# Patient Record
Sex: Male | Born: 1955
Health system: Southern US, Community
[De-identification: ages and names within clinical notes are randomized; demographics above are authoritative.]

## PROBLEM LIST (undated history)

## (undated) DIAGNOSIS — C61 Malignant neoplasm of prostate: Secondary | ICD-10-CM

## (undated) DIAGNOSIS — Z789 Other specified health status: Secondary | ICD-10-CM

## (undated) DIAGNOSIS — IMO0002 Reserved for concepts with insufficient information to code with codable children: Secondary | ICD-10-CM

## (undated) HISTORY — PX: OTHER SURGICAL HISTORY: SHX169

## (undated) HISTORY — DX: Malignant neoplasm of prostate: C61

## (undated) HISTORY — PX: PROSTATE BIOPSY: SHX241

---

## 2001-12-25 DIAGNOSIS — IMO0002 Reserved for concepts with insufficient information to code with codable children: Secondary | ICD-10-CM

## 2001-12-25 HISTORY — DX: Reserved for concepts with insufficient information to code with codable children: IMO0002

## 2004-08-17 ENCOUNTER — Ambulatory Visit (HOSPITAL_COMMUNITY): Admission: RE | Admit: 2004-08-17 | Discharge: 2004-08-17 | Payer: Self-pay | Admitting: Family Medicine

## 2004-09-26 ENCOUNTER — Encounter: Admission: RE | Admit: 2004-09-26 | Discharge: 2004-09-26 | Payer: Self-pay | Admitting: Neurosurgery

## 2009-06-01 ENCOUNTER — Encounter: Admission: RE | Admit: 2009-06-01 | Discharge: 2009-06-01 | Payer: Self-pay | Admitting: Family Medicine

## 2011-01-14 ENCOUNTER — Encounter: Payer: Self-pay | Admitting: Neurosurgery

## 2014-06-20 ENCOUNTER — Emergency Department (HOSPITAL_COMMUNITY)
Admission: EM | Admit: 2014-06-20 | Discharge: 2014-06-20 | Disposition: A | Discharge status: Home / Self Care | Payer: BC Managed Care – PPO | Attending: Emergency Medicine | Admitting: Emergency Medicine

## 2014-06-20 ENCOUNTER — Encounter (HOSPITAL_COMMUNITY): Payer: Self-pay | Admitting: Emergency Medicine

## 2014-06-20 DIAGNOSIS — J028 Acute pharyngitis due to other specified organisms: Secondary | ICD-10-CM

## 2014-06-20 DIAGNOSIS — B9789 Other viral agents as the cause of diseases classified elsewhere: Secondary | ICD-10-CM

## 2014-06-20 DIAGNOSIS — J029 Acute pharyngitis, unspecified: Secondary | ICD-10-CM | POA: Insufficient documentation

## 2014-06-20 LAB — RAPID STREP SCREEN (MED CTR MEBANE ONLY): Streptococcus, Group A Screen (Direct): NEGATIVE

## 2014-06-20 MED ORDER — LIDOCAINE VISCOUS 2 % MT SOLN: 15.0000 mL | OROMUCOSAL | Status: DC | PRN

## 2014-06-20 NOTE — ED Provider Notes (Signed)
CSN: [id number]     Arrival date & time 06/20/14  1002 History   First MD Initiated Contact with Patient 06/20/14 1043     Chief Complaint  Patient presents with  . Sore Throat     (Consider location/radiation/quality/duration/timing/severity/associated sxs/prior Treatment) HPI  Terry Davenport is a 58 y.o. male complaining of sore throat onset 2 days ago associated with minimal rhinorrhea and medication was exposed to strep pharyngitis from his nephew several days ago. Patient denies cough, shortness of breath, chest pain, abdominal pain, nausea, vomiting, rash, headache, cervicalgia, change in bowel or bladder habits, nausea or vomiting. Issues going on vacation tomorrow and is worried that he will get sicker on vacation. Eating and drinking normally.  History reviewed. No pertinent past medical history. History reviewed. No pertinent past surgical history. Family History  Problem Relation Age of Onset  . Heart failure Mother   . Diabetes Mother   . Hypertension Mother   . Cancer Mother    History  Substance Use Topics  . Smoking status: Never Smoker   . Smokeless tobacco: Never Used  . Alcohol Use: Yes     Comment: 6 beers/week    Review of Systems  10 systems reviewed and found to be negative, except as noted in the HPI.   Allergies  Sulfa antibiotics  Home Medications   Prior to Admission medications   Medication Sig Start Date End Date Taking? Authorizing Provider  clobetasol (TEMOVATE) 0.05 % external solution Apply 1 application topically 2 (two) times daily as needed (itching).   Yes Historical Provider, MD  Multiple Vitamin (MULTIVITAMIN WITH MINERALS) TABS tablet Take 1 tablet by mouth daily.   Yes Historical Provider, MD  lidocaine (XYLOCAINE) 2 % solution Use as directed 15 mLs in the mouth or throat every 4 (four) hours as needed. 06/20/14   Nicole Pisciotta, PA-C   BP 131/91  Pulse 69  Temp(Src) 98.2 F (36.8 C) (Oral)  Resp 14  Ht 6\' 1"  (1.854 m)  Wt  163 lb (73.936 kg)  BMI 21.51 kg/m2  SpO2 99% Physical Exam  Nursing note and vitals reviewed. Constitutional: He is oriented to person, place, and time. He appears well-developed and well-nourished. No distress.  HENT:  Head: Normocephalic and atraumatic.  Mouth/Throat: Oropharynx is clear and moist.  No drooling or stridor. Posterior pharynx mildly erythematous no significant tonsillar hypertrophy. No exudate. Soft palate rises symmetrically. No TTP or induration under tongue.   No tenderness to palpation of frontal or bilateral maxillary sinuses.  No mucosal edema in the nares.  Bilateral tympanic membranes with normal architecture and good light reflex.    Eyes: Conjunctivae and EOM are normal. Pupils are equal, round, and reactive to light.  Neck: Normal range of motion.  Cardiovascular: Normal rate, regular rhythm and intact distal pulses.   Pulmonary/Chest: Effort normal and breath sounds normal. No stridor. No respiratory distress. He has no wheezes. He has no rales. He exhibits no tenderness.  Abdominal: Soft. Bowel sounds are normal. He exhibits no distension and no mass. There is no tenderness. There is no rebound and no guarding.  Musculoskeletal: Normal range of motion. He exhibits no edema and no tenderness.  Lymphadenopathy:    He has no cervical adenopathy.  Neurological: He is alert and oriented to person, place, and time.  Psychiatric:  anxious    ED Course  Procedures (including critical care time) Labs Review Labs Reviewed  RAPID STREP SCREEN  CULTURE, GROUP A STREP    Imaging Review  No results found.   EKG Interpretation None      MDM   Final diagnoses:  Acute viral pharyngitis    Filed Vitals:   06/20/14 1011  BP: 131/91  Pulse: 69  Temp: 98.2 F (36.8 C)  TempSrc: Oral  Resp: 14  Height: 6\' 1"  (1.854 m)  Weight: 163 lb (73.936 kg)  SpO2: 99%    Medications - No data to display  Terry Davenport is a 58 y.o. male presenting with  sore throat for 2 days. H. patient is afebrile. Physical exam is not consistent with a strep pharyngitis. Rapid strep is negative.  Evaluation does not show pathology that would require ongoing emergent intervention or inpatient treatment. Pt is hemodynamically stable and mentating appropriately. Discussed findings and plan with patient/guardian, who agrees with care plan. All questions answered. Return precautions discussed and outpatient follow up given.   New Prescriptions   LIDOCAINE (XYLOCAINE) 2 % SOLUTION    Use as directed 15 mLs in the mouth or throat every 4 (four) hours as needed.         Monico Blitz, PA-C 06/20/14 1141

## 2014-06-20 NOTE — Discharge Instructions (Signed)
Push fluids: take small frequent sips of water or Gatorade, do not drink any soda, juice or caffeinated beverages.    For pain control please take Ibuprofen (also known as Motrin or Advil) 400mg  (this is normally 2 over the counter pills) every 6 hours. Take with food to minimize stomach irritation.  Please follow with your primary care doctor in the next 2 days for a check-up. They must obtain records for further management.   Do not hesitate to return to the Emergency Department for any new, worsening or concerning symptoms.

## 2014-06-20 NOTE — ED Provider Notes (Signed)
Medical screening examination/treatment/procedure(s) were performed by non-physician practitioner and as supervising physician I was immediately available for consultation/collaboration.    [name], MD 06/20/14 1617 

## 2014-06-20 NOTE — ED Notes (Signed)
Patient states he has had a sore throat x 2 days. Patient states he has been around a relative that had strep throat.

## 2014-06-22 LAB — CULTURE, GROUP A STREP

## 2014-09-02 ENCOUNTER — Ambulatory Visit: Payer: BC Managed Care – PPO | Admitting: Radiation Oncology

## 2014-09-02 ENCOUNTER — Ambulatory Visit: Payer: BC Managed Care – PPO

## 2014-09-04 ENCOUNTER — Encounter: Payer: Self-pay | Admitting: Radiation Oncology

## 2014-09-04 NOTE — Progress Notes (Signed)
GU Location of Tumor / Histology: prostatic adenocarcinoma  If Prostate Cancer, Gleason Score is (3 + 3) and PSA is (4.50)  Clydene Laming presented to Dr. Jasmine December in June 2015 with an elevated PSA of 4.08  Biopsies of prostate (if applicable) revealed:    Past/Anticipated interventions by urology, if any: prostate biopsy and referral to Dr. Tammi Klippel   Past/Anticipated interventions by medical oncology, if any: no  Weight changes, if any: no  Bowel/Bladder complaints, if any: no   Nausea/Vomiting, if any: no  Pain issues, if any:  no  SAFETY ISSUES:  Prior radiation? no  Pacemaker/ICD? no  Possible current pregnancy? no  Is the patient on methotrexate? no  Current Complaints / other details:  58 year old male. Married with 1 son.  Art gallery manager for [employer]. QQ:VZDGL drugs 29.5 gram prostate.

## 2014-09-06 ENCOUNTER — Encounter: Payer: Self-pay | Admitting: Radiation Oncology

## 2014-09-06 DIAGNOSIS — C61 Malignant neoplasm of prostate: Secondary | ICD-10-CM | POA: Insufficient documentation

## 2014-09-06 NOTE — Progress Notes (Signed)
Radiation Oncology         [phone]) [phone] ________________________________  Initial outpatient Consultation  Name: [name] MRN: [id number]  Date: 09/07/2014  DOB: [birth year]  VO:ZDGUY,QIHKV RUTH, MD  Alexis Frock, MD   REFERRING PHYSICIAN: Alexis Frock, MD  DIAGNOSIS: 58 y.o. gentleman with stage T1c adenocarcinoma of the prostate with a Gleason's score of 3+3 and a PSA of 4.5  HISTORY OF PRESENT ILLNESS::Terry Davenport is a 58 y.o. gentleman.  He was noted to have an elevated PSA of 4.08 on 05/08/14 by his primary care physician, Dr. Darcus Austin.  Accordingly, he was referred for evaluation in urology by Dr. Jasmine December on 06/01/14,  digital rectal examination was performed at that time revealing a 30 gm prostate with no nodules.  Repeat PSA on 05/28/14 remained elevated at 4.5.  The patient proceeded to transrectal ultrasound with 12 biopsies of the prostate on 08/03/14.  The prostate volume measured 29.53 cc.  Out of 12 core biopsies, 3 were positive.  The maximum Gleason score was 3+3, and this was seen in the distribution seen below on the right side.  The patient reviewed the biopsy results with his urologist and he has kindly been referred today for discussion of potential radiation treatment options.  PREVIOUS RADIATION THERAPY: No  PAST MEDICAL HISTORY:  has a past medical history of Prostate cancer.    PAST SURGICAL HISTORY: Past Surgical History  Procedure Laterality Date  . Prostate biopsy      FAMILY HISTORY: family history includes Cancer in his mother; Diabetes in his mother; Heart failure in his mother; Hypertension in his mother.  SOCIAL HISTORY:  reports that he has never smoked. He has never used smokeless tobacco. He reports that he drinks alcohol. He reports that he does not use illicit drugs.  ALLERGIES: Sulfa antibiotics  MEDICATIONS:  Current Outpatient Prescriptions  Medication Sig Dispense Refill  . Ascorbic Acid (VITAMIN C) 1000 MG tablet Take 1,000 mg  by mouth daily.      . Multiple Vitamin (MULTIVITAMIN WITH MINERALS) TABS tablet Take 1 tablet by mouth daily.       No current facility-administered medications for this encounter.    REVIEW OF SYSTEMS:  A 15 point review of systems is documented in the electronic medical record. This was obtained by the nursing staff. However, I reviewed this with the patient to discuss relevant findings and make appropriate changes.  A comprehensive review of systems was negative..  The patient completed an IPSS and IIEF questionnaire.  His IPSS score was low indicating mild urinary outflow obstructive symptoms.  He indicated that his erectile function is able to complete sexual activity on most attempts.   PHYSICAL EXAM: This patient is in no acute distress.  He is alert and oriented.   height is 6\' 1"  (1.854 m) and weight is 168 lb 14.4 oz (76.613 kg). His oral temperature is 98 F (36.7 C). His blood pressure is 133/82 and his pulse is 62. His respiration is 16 and oxygen saturation is 100%.  He exhibits no respiratory distress or labored breathing.  He appears neurologically intact.  His mood is pleasant.  His affect is appropriate.  Please note the digital rectal exam findings described above.  KPS = 100  100 - Normal; no complaints; no evidence of disease. 90   - Able to carry on normal activity; minor signs or symptoms of disease. 80   - Normal activity with effort; some signs or symptoms of disease. 70   -  Cares for self; unable to carry on normal activity or to do active work. 60   - Requires occasional assistance, but is able to care for most of his personal needs. 50   - Requires considerable assistance and frequent medical care. 77   - Disabled; requires special care and assistance. 50   - Severely disabled; hospital admission is indicated although death not imminent. 51   - Very sick; hospital admission necessary; active supportive treatment necessary. 10   - Moribund; fatal processes  progressing rapidly. 0     - Dead  Karnofsky DA, Abelmann WH, Craver LS and Burchenal JH [phone]) The use of the nitrogen mustards in the palliative treatment of carcinoma: with particular reference to bronchogenic carcinoma Cancer 1 [phone]   LABORATORY DATA:  No results found for this basename: WBC,  HGB,  HCT,  MCV,  PLT   No results found for this basename: NA,  K,  CL,  CO2   No results found for this basename: ALT,  AST,  GGT,  ALKPHOS,  BILITOT     RADIOGRAPHY: No results found.    IMPRESSION: This gentleman is a 58 y.o. gentleman with stage T1c adenocarcinoma of the prostate with a Gleason's score of 3+3 and a PSA of 4.5.  His T-Stage, Gleason's Score, and PSA put him into the favorable or low risk group.  Accordingly he is eligible for a variety of potential treatment options including active surveillance, radical prostatectomy, external radiotherapy, or prostate brachytherapy.  PLAN:Today I reviewed the findings and workup thus far.  We discussed the natural history of prostate cancer.  We reviewed the the implications of T-stage, Gleason's Score, and PSA on decision-making and outcomes in prostate cancer.  We discussed radiation treatment in the management of prostate cancer with regard to the logistics and delivery of external beam radiation treatment as well as the logistics and delivery of prostate brachytherapy.  We compared and contrasted each of these approaches and also compared these against prostatectomy.  The patient expressed some interest in prostate brachytherapy, but, remains undecided and may elect for surgery.  He sees Dr. Alinda Money for 2nd opinion soon.  I will share my findings with Dr. Tresa Moore.  I enjoyed meeting with him today, and will look forward to participating in the care of this very nice gentleman.   I spent 60 minutes face to face with the patient and more than 50% of that time was spent in counseling and/or coordination of care.     ------------------------------------------------  Sheral Apley. Tammi Klippel, M.D.

## 2014-09-07 ENCOUNTER — Ambulatory Visit
Admission: RE | Admit: 2014-09-07 | Discharge: 2014-09-07 | Disposition: A | Discharge status: Home / Self Care | Payer: BC Managed Care – PPO | Source: Ambulatory Visit | Attending: Radiation Oncology | Admitting: Radiation Oncology

## 2014-09-07 ENCOUNTER — Telehealth: Payer: Self-pay | Admitting: Radiation Oncology

## 2014-09-07 ENCOUNTER — Encounter: Payer: Self-pay | Admitting: Radiation Oncology

## 2014-09-07 VITALS — BP 133/82 | HR 62 | Temp 98.0°F | Resp 16 | Ht 73.0 in | Wt 168.9 lb

## 2014-09-07 DIAGNOSIS — Z510 Encounter for antineoplastic radiation therapy: Secondary | ICD-10-CM | POA: Insufficient documentation

## 2014-09-07 DIAGNOSIS — C61 Malignant neoplasm of prostate: Secondary | ICD-10-CM | POA: Diagnosis not present

## 2014-09-07 NOTE — Progress Notes (Signed)
Reports a weaker urine stream compared to his youth but, denies this is problematic. Reports rare does his urine stream stop and start. Denies difficulty emptying his bladder, leakage or incontinence. Vitals stable. Denies pain. Denies nocturia or dysuria. Denies constipation, diarrhea, blood in stool or referred pain to prostate. Reports intermittent aching since biopsy in his left testicle. Denies weight loss or night sweats.

## 2014-09-07 NOTE — Progress Notes (Signed)
See progress note under physician encounter. 

## 2014-09-07 NOTE — Telephone Encounter (Signed)
Patient a no show for 1430 appointment with Dr. Tammi Klippel.Phoned patient at home and on his cell. No answer. Left message requesting return call to reschedule.

## 2014-09-11 ENCOUNTER — Encounter: Payer: Self-pay | Admitting: *Deleted

## 2014-09-11 NOTE — Progress Notes (Signed)
[place] Psychosocial Distress Screening Clinical Social Work  Clinical Social Work was referred by distress screening protocol.  The patient scored a 7 on the Psychosocial Distress Thermometer which indicates severe distress. Clinical Social Worker phoned to assess for distress and other psychosocial needs as he had no other appointments scheduled at this time. Pt open with CSW and reports he was well informed at his appointment. He is still trying to figure out his plan to treat his prostate cancer. CSW introduced self and role of Patient and Family Support Team. CSW also discussed Prostate Support Group and other resources at the [facility] to assist. Pt reports he feels less distressed currently and has good family support to assist him. He reports good benefits through his work to assist as well. Pt aware how to contact CSW as needed.  No other needs identified.    ONCBCN DISTRESS SCREENING 09/07/2014  Screening Type Initial Screening  Mark the number that describes how much distress you have been experiencing in the past week 7    Clinical Social Worker follow up needed: No.  If yes, follow up plan:  Loren Racer, [place]  [facility] Phone: [phone] Fax: [phone]

## 2014-09-21 ENCOUNTER — Other Ambulatory Visit: Payer: Self-pay | Admitting: Urology

## 2014-11-04 ENCOUNTER — Encounter (HOSPITAL_COMMUNITY)
Admission: RE | Admit: 2014-11-04 | Discharge: 2014-11-04 | Disposition: A | Discharge status: Home / Self Care | Payer: BC Managed Care – PPO | Source: Ambulatory Visit | Attending: Urology | Admitting: Urology

## 2014-11-04 ENCOUNTER — Encounter (INDEPENDENT_AMBULATORY_CARE_PROVIDER_SITE_OTHER): Payer: Self-pay

## 2014-11-04 ENCOUNTER — Ambulatory Visit (HOSPITAL_COMMUNITY)
Admission: RE | Admit: 2014-11-04 | Discharge: 2014-11-04 | Disposition: A | Discharge status: Home / Self Care | Payer: BC Managed Care – PPO | Source: Ambulatory Visit | Attending: Urology | Admitting: Urology

## 2014-11-04 ENCOUNTER — Encounter (HOSPITAL_COMMUNITY): Payer: Self-pay

## 2014-11-04 DIAGNOSIS — C61 Malignant neoplasm of prostate: Secondary | ICD-10-CM

## 2014-11-04 DIAGNOSIS — Z01818 Encounter for other preprocedural examination: Secondary | ICD-10-CM | POA: Diagnosis not present

## 2014-11-04 HISTORY — DX: Other specified health status: Z78.9

## 2014-11-04 HISTORY — DX: Reserved for concepts with insufficient information to code with codable children: IMO0002

## 2014-11-04 LAB — CBC
HCT: 46.2 % (ref 39.0–52.0)
Hemoglobin: 15.5 g/dL (ref 13.0–17.0)
MCH: 29.3 pg (ref 26.0–34.0)
MCHC: 33.5 g/dL (ref 30.0–36.0)
MCV: 87.3 fL (ref 78.0–100.0)
PLATELETS: 334 10*3/uL (ref 150–400)
RBC: 5.29 MIL/uL (ref 4.22–5.81)
RDW: 12.3 % (ref 11.5–15.5)
WBC: 5.4 10*3/uL (ref 4.0–10.5)

## 2014-11-04 LAB — BASIC METABOLIC PANEL
Anion gap: 8 (ref 5–15)
BUN: 14 mg/dL (ref 6–23)
CO2: 30 mEq/L (ref 19–32)
CREATININE: 0.97 mg/dL (ref 0.50–1.35)
Calcium: 9.7 mg/dL (ref 8.4–10.5)
Chloride: 105 mEq/L (ref 96–112)
GFR, EST NON AFRICAN AMERICAN: 90 mL/min — AB (ref >90)
Glucose, Bld: 88 mg/dL (ref 70–99)
Potassium: 4.9 mEq/L (ref 3.7–5.3)
SODIUM: 143 meq/L (ref 137–147)

## 2014-11-04 LAB — ABO/RH: ABO/RH(D): A POS

## 2014-11-04 NOTE — Patient Instructions (Signed)
FOLLOW YOUR BOWEL PREP INSTRUCTIONS DAY BEFORE SURGERY - INSTRUCTIONS ARE FROM DR. BORDEN'S OFFICE.      YOUR SURGERY IS SCHEDULED AT [facility]  ON:  Monday  November 16  REPORT TO  SHORT STAY CENTER AT:  9:00 AM    DO NOT EAT OR DRINK ANYTHING AFTER MIDNIGHT THE NIGHT BEFORE YOUR SURGERY.  YOU MAY BRUSH YOUR TEETH, RINSE OUT YOUR MOUTH--BUT NO WATER, NO FOOD, NO CHEWING GUM, NO MINTS, NO CANDIES, NO CHEWING TOBACCO.  PLEASE TAKE THE FOLLOWING MEDICATIONS THE AM OF YOUR SURGERY WITH A FEW SIPS OF WATER:  NO MEDICATIONS TO TAKE  .  DO NOT BRING VALUABLES, MONEY, CREDIT CARDS.  DO NOT WEAR JEWELRY, MAKE-UP, NAIL POLISH AND NO METAL PINS OR CLIPS IN YOUR HAIR. CONTACT LENS, DENTURES / PARTIALS, GLASSES SHOULD NOT BE WORN TO SURGERY AND IN MOST CASES-HEARING AIDS WILL NEED TO BE REMOVED.  BRING YOUR GLASSES CASE, ANY EQUIPMENT NEEDED FOR YOUR CONTACT LENS. FOR PATIENTS ADMITTED TO THE HOSPITAL--CHECK OUT TIME THE DAY OF DISCHARGE IS 11:00 AM.  ALL INPATIENT ROOMS ARE PRIVATE - WITH BATHROOM, TELEPHONE, TELEVISION AND WIFI INTERNET.    PLEASE BE AWARE THAT YOU MAY NEED ADDITIONAL BLOOD DRAWN DAY OF YOUR SURGERY  _______________________________________________________________________   [facility] - Preparing for Surgery Before surgery, you can play an important role.  Because skin is not sterile, your skin needs to be as free of germs as possible.  You can reduce the number of germs on your skin by washing with CHG (chlorahexidine gluconate) soap before surgery.  CHG is an antiseptic cleaner which kills germs and bonds with the skin to continue killing germs even after washing. Please DO NOT use if you have an allergy to CHG or antibacterial soaps.  If your skin becomes reddened/irritated stop using the CHG and inform your nurse when you arrive at Short Stay. Do not shave (including legs and underarms) for at least 48 hours prior to the first CHG shower.  You may shave your  face/neck. Please follow these instructions carefully:  1.  Shower with CHG Soap the night before surgery and the  morning of Surgery.  2.  If you choose to wash your hair, wash your hair first as usual with your  normal  shampoo.  3.  After you shampoo, rinse your hair and body thoroughly to remove the  shampoo.                           4.  Use CHG as you would any other liquid soap.  You can apply chg directly  to the skin and wash                       Gently with a scrungie or clean washcloth.  5.  Apply the CHG Soap to your body ONLY FROM THE NECK DOWN.   Do not use on face/ open                           Wound or open sores. Avoid contact with eyes, ears mouth and genitals (private parts).                       Wash face,  Genitals (private parts) with your normal soap.             6.  Wash thoroughly,  paying special attention to the area where your surgery  will be performed.  7.  Thoroughly rinse your body with warm water from the neck down.  8.  DO NOT shower/wash with your normal soap after using and rinsing off  the CHG Soap.                9.  Pat yourself dry with a clean towel.            10.  Wear clean pajamas.            11.  Place clean sheets on your bed the night of your first shower and do not  sleep with pets. Day of Surgery : Do not apply any lotions/deodorants the morning of surgery.  Please wear clean clothes to the hospital/surgery center.  FAILURE TO FOLLOW THESE INSTRUCTIONS MAY RESULT IN THE CANCELLATION OF YOUR SURGERY PATIENT SIGNATURE_________________________________  NURSE SIGNATURE__________________________________  ________________________________________________________________________   Adam Phenix  An incentive spirometer is a tool that can help keep your lungs clear and active. This tool measures how well you are filling your lungs with each breath. Taking long deep breaths may help reverse or decrease the chance of developing breathing  (pulmonary) problems (especially infection) following:  A long period of time when you are unable to move or be active. BEFORE THE PROCEDURE   If the spirometer includes an indicator to show your best effort, your nurse or respiratory therapist will set it to a desired goal.  If possible, sit up straight or lean slightly forward. Try not to slouch.  Hold the incentive spirometer in an upright position. INSTRUCTIONS FOR USE   Sit on the edge of your bed if possible, or sit up as far as you can in bed or on a chair.  Hold the incentive spirometer in an upright position.  Breathe out normally.  Place the mouthpiece in your mouth and seal your lips tightly around it.  Breathe in slowly and as deeply as possible, raising the piston or the ball toward the top of the column.  Hold your breath for 3-5 seconds or for as long as possible. Allow the piston or ball to fall to the bottom of the column.  Remove the mouthpiece from your mouth and breathe out normally.  Rest for a few seconds and repeat Steps 1 through 7 at least 10 times every 1-2 hours when you are awake. Take your time and take a few normal breaths between deep breaths.  The spirometer may include an indicator to show your best effort. Use the indicator as a goal to work toward during each repetition.  After each set of 10 deep breaths, practice coughing to be sure your lungs are clear. If you have an incision (the cut made at the time of surgery), support your incision when coughing by placing a pillow or rolled up towels firmly against it. Once you are able to get out of bed, walk around indoors and cough well. You may stop using the incentive spirometer when instructed by your caregiver.  RISKS AND COMPLICATIONS  Take your time so you do not get dizzy or light-headed.  If you are in pain, you may need to take or ask for pain medication before doing incentive spirometry. It is harder to take a deep breath if you are having  pain. AFTER USE  Rest and breathe slowly and easily.  It can be helpful to keep track of a log of your progress. Your caregiver  can provide you with a simple table to help with this. If you are using the spirometer at home, follow these instructions: [place] IF:   You are having difficultly using the spirometer.  You have trouble using the spirometer as often as instructed.  Your pain medication is not giving enough relief while using the spirometer.  You develop fever of 100.5 F (38.1 C) or higher. SEEK IMMEDIATE MEDICAL CARE IF:   You cough up bloody sputum that had not been present before.  You develop fever of 102 F (38.9 C) or greater.  You develop worsening pain at or near the incision site. MAKE SURE YOU:   Understand these instructions.  Will watch your condition.  Will get help right away if you are not doing well or get worse. Document Released: 04/23/2007 Document Revised: 03/04/2012 Document Reviewed: 06/24/2007 Exit[employer] Patient Information 2014 ExitCare, [place].   ________________________________________________________________________  WHAT IS A BLOOD TRANSFUSION? Blood Transfusion Information  A transfusion is the replacement of blood or some of its parts. Blood is made up of multiple cells which provide different functions.  Red blood cells carry oxygen and are used for blood loss replacement.  White blood cells fight against infection.  Platelets control bleeding.  Plasma helps clot blood.  Other blood products are available for specialized needs, such as hemophilia or other clotting disorders. BEFORE THE TRANSFUSION  Who gives blood for transfusions?   Healthy volunteers who are fully evaluated to make sure their blood is safe. This is blood bank blood. Transfusion therapy is the safest it has ever been in the practice of medicine. Before blood is taken from a donor, a complete history is taken to make sure that person has no history  of diseases nor engages in risky social behavior (examples are intravenous drug use or sexual activity with multiple partners). The donor's travel history is screened to minimize risk of transmitting infections, such as malaria. The donated blood is tested for signs of infectious diseases, such as HIV and hepatitis. The blood is then tested to be sure it is compatible with you in order to minimize the chance of a transfusion reaction. If you or a relative donates blood, this is often done in anticipation of surgery and is not appropriate for emergency situations. It takes many days to process the donated blood. RISKS AND COMPLICATIONS Although transfusion therapy is very safe and saves many lives, the main dangers of transfusion include:   Getting an infectious disease.  Developing a transfusion reaction. This is an allergic reaction to something in the blood you were given. Every precaution is taken to prevent this. The decision to have a blood transfusion has been considered carefully by your caregiver before blood is given. Blood is not given unless the benefits outweigh the risks. AFTER THE TRANSFUSION  Right after receiving a blood transfusion, you will usually feel much better and more energetic. This is especially true if your red blood cells have gotten low (anemic). The transfusion raises the level of the red blood cells which carry oxygen, and this usually causes an energy increase.  The nurse administering the transfusion will monitor you carefully for complications. HOME CARE INSTRUCTIONS  No special instructions are needed after a transfusion. You may find your energy is better. Speak with your caregiver about any limitations on activity for underlying diseases you may have. SEEK MEDICAL CARE IF:   Your condition is not improving after your transfusion.  You develop redness or irritation at the intravenous (  IV) site. SEEK IMMEDIATE MEDICAL CARE IF:  Any of the following symptoms  occur over the next 12 hours:  Shaking chills.  You have a temperature by mouth above 102 F (38.9 C), not controlled by medicine.  Chest, back, or muscle pain.  People around you feel you are not acting correctly or are confused.  Shortness of breath or difficulty breathing.  Dizziness and fainting.  You get a rash or develop hives.  You have a decrease in urine output.  Your urine turns a dark color or changes to pink, red, or brown. Any of the following symptoms occur over the next 10 days:  You have a temperature by mouth above 102 F (38.9 C), not controlled by medicine.  Shortness of breath.  Weakness after normal activity.  The white part of the eye turns yellow (jaundice).  You have a decrease in the amount of urine or are urinating less often.  Your urine turns a dark color or changes to pink, red, or brown. Document Released: 12/08/2000 Document Revised: 03/04/2012 Document Reviewed: 07/27/2008 [facility] Patient Information 2014 [place], [place].  _______________________________________________________________________

## 2014-11-04 NOTE — Pre-Procedure Instructions (Signed)
EKG AND CXR WERE DONE TODAY PREOP AT [facility]. PT'S CHART GIVEN TO FOLLOW UP NURSE SHARON SHOFFNER, RN FOR REVIEW OF ALL PREOP TEST RESULTS.

## 2014-11-08 NOTE — H&P (Signed)
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 This eventually prompted a prostate needle biopsy by Dr. Tresa Moore on 08/03/14 confirming Gleason 3+3=6 adenocarcinoma of the prostate with 3 out of 12 biopsy cores positive for malignancy. He has no family history of prostate cancer and he is otherwise completely healthy with no other medical comorbidities. He has elected to proceed with surgical therapy.    TNM stage: cT1c Nx Mx  PSA: 4.5  Gleason score: 3+3=6  Biopsy (810/15): 3/12 cores positive -- R apex (20%), R lateral mid (30%), R base (5%)  Prostate volume: 29.5 cc  PSAD: 0.15    Nomogram  OC disease: 79%  EPE: 16%  SVI: 1%  LNI: 1%  PFS (surgery): 94% at 5 years and 90% at 10 years    Urinary function: He has minimal lower urinary tract symptoms. IPSS is 4.  Erectile function: He denies erectile dysfunction. SHIM score is 24.     Interval history:    He returns today after having made the decision to proceed with surgical treatment of his prostate cancer.     Past Medical History Problems  1. History of No significant past medical history  Surgical History Problems  1. History of No Surgical Problems  Current Meds 1. Ciprofloxacin HCl - 500 MG Oral Tablet; 1 tab PO BID x 6 days. Begin 3 days before  biopsy;  Therapy: [id number] to (Last Rx:13Jul2015)  Requested for: 13Jul2015 Ordered 2. Multi-Vitamin TABS;  Therapy: (Recorded:04Jun2015) to Recorded 3. Vitamin C TABS;  Therapy: (Recorded:13Jul2015) to Recorded  Allergies Medication  1. Sulfa Drugs  Family History Problems  1. Family history of Emphysema/COPD : Father 2. Family history of chronic obstructive pulmonary disease (Z82.5) : Father 3. Family history of hypertension (Z82.49) : Mother 4. Family history of malignant neoplasm of breast (Z80.3) : Mother  Social  History Problems  1. Activities of daily living (ADL's), independent 2. Alcohol use (F10.99)   <1 a day 3. Exercise habits   No formal exercise at the gym however he indicates he walks systolic for 30 minutes on     a daily basis and is independent with all chores. 4. Married 5. Never a smoker 6. Occupation   Chief Financial Officer at [employer] Includes: Last 1 Day]  Recorded: 20Oct2015 08:22AM  Blood Pressure: 134 / 73 Temperature: 97.6 F Heart Rate: 64  Physical Exam Constitutional: Well nourished and well developed . No acute distress.  ENT:. The ears and nose are normal in appearance.  Neck: The appearance of the neck is normal and no neck mass is present.  Pulmonary: No respiratory distress and normal respiratory rhythm and effort.  Cardiovascular: Heart rate and rhythm are normal . No peripheral edema.  Neuro/Psych:. Mood and affect are appropriate.    Results/Data Urine [Data Includes: Last 1 Day]   20Oct2015  COLOR YELLOW   APPEARANCE CLEAR   SPECIFIC GRAVITY 1.020   pH 5.5   GLUCOSE NEG mg/dL  BILIRUBIN NEG   KETONE NEG mg/dL  BLOOD NEG   PROTEIN NEG mg/dL  UROBILINOGEN 0.2 mg/dL  NITRITE NEG   LEUKOCYTE ESTERASE NEG    Assessment Assessed  1. Prostate cancer (C61)  Plan Health Maintenance  1. UA With REFLEX; [Do Not Release]; Status:Complete;   Done: 20Oct2015 08:15AM Prostate cancer  2. Follow-up Keep Future Appt Office  Follow-up  Status: Complete  Done: 20Oct2015  Discussion/Summary 1. Prostate cancer: He is ready to proceed with surgical treatment of his prostate cancer and has been scheduled. He does have some questions regarding his physical therapy exercises and I will ask Hilda Blades to address these with him prior to his surgery. He feels very well informed and all questions have been answered to his stated satisfaction.   We discussed surgical therapy for prostate cancer including the different available surgical approaches. We  discussed, in detail, the risks and expectations of surgery with regard to cancer control, urinary control, and erectile function as well as the expected postoperative recovery process. Additional risks of surgery including but not limited to bleeding, infection, hernia formation, nerve damage, lymphocele formation, bowel/rectal injury potentially necessitating colostomy, damage to the urinary tract resulting in urine leakage, urethral stricture, and the cardiopulmonary risks such as myocardial infarction, stroke, death, venothromboembolism, etc. were explained. The risk of open surgical conversion for robotic/laparoscopic prostatectomy was also discussed.     We will plan to proceed with a bilateral nerve sparing robotic-assisted left prostatic radical prostatectomy.    Cc: Dr. Darcus Austin  A total of 55 minutes were spent in the overall care of the patient today with 50 minutes in direct face to face consultation.    Signatures Electronically signed by : Raynelle Bring, M.D.; Oct 13 2014  2:03PM EST

## 2014-11-09 ENCOUNTER — Inpatient Hospital Stay (HOSPITAL_COMMUNITY)
Admission: RE | Admit: 2014-11-09 | Discharge: 2014-11-10 | DRG: 708 | Disposition: A | Discharge status: Home / Self Care | Payer: BC Managed Care – PPO | Source: Ambulatory Visit | Attending: Urology | Admitting: Urology

## 2014-11-09 ENCOUNTER — Encounter (HOSPITAL_COMMUNITY): Payer: Self-pay | Admitting: *Deleted

## 2014-11-09 ENCOUNTER — Inpatient Hospital Stay (HOSPITAL_COMMUNITY): Payer: BC Managed Care – PPO | Admitting: Certified Registered Nurse Anesthetist

## 2014-11-09 ENCOUNTER — Encounter (HOSPITAL_COMMUNITY)
Admission: RE | Disposition: A | Discharge status: Home / Self Care | Payer: Self-pay | Source: Ambulatory Visit | Attending: Urology

## 2014-11-09 DIAGNOSIS — C61 Malignant neoplasm of prostate: Secondary | ICD-10-CM | POA: Diagnosis present

## 2014-11-09 HISTORY — PX: ROBOT ASSISTED LAPAROSCOPIC RADICAL PROSTATECTOMY: SHX5141

## 2014-11-09 LAB — TYPE AND SCREEN
ABO/RH(D): A POS
Antibody Screen: NEGATIVE

## 2014-11-09 LAB — HEMOGLOBIN AND HEMATOCRIT, BLOOD
HEMATOCRIT: 42.5 % (ref 39.0–52.0)
HEMOGLOBIN: 14.7 g/dL (ref 13.0–17.0)

## 2014-11-09 SURGERY — ROBOTIC ASSISTED LAPAROSCOPIC RADICAL PROSTATECTOMY LEVEL 1
Anesthesia: General

## 2014-11-09 MED ORDER — ROCURONIUM BROMIDE 100 MG/10ML IV SOLN
INTRAVENOUS | Status: AC
  Filled 2014-11-09: qty 1

## 2014-11-09 MED ORDER — LACTATED RINGERS IV SOLN: INTRAVENOUS | Status: DC

## 2014-11-09 MED ORDER — MIDAZOLAM HCL 5 MG/5ML IJ SOLN
INTRAMUSCULAR | Status: DC | PRN
  Administered 2014-11-09: 2 mg via INTRAVENOUS

## 2014-11-09 MED ORDER — HEPARIN SODIUM (PORCINE) 1000 UNIT/ML IJ SOLN
INTRAMUSCULAR | Status: AC
  Filled 2014-11-09: qty 1

## 2014-11-09 MED ORDER — KETOROLAC TROMETHAMINE 15 MG/ML IJ SOLN
15.0000 mg | Freq: Four times a day (QID) | INTRAMUSCULAR | Status: DC
  Administered 2014-11-09 – 2014-11-10 (×3): 15 mg via INTRAVENOUS
  Filled 2014-11-09 (×5): qty 1

## 2014-11-09 MED ORDER — SODIUM CHLORIDE 0.9 % IV SOLN: 250.0000 mL | INTRAVENOUS | Status: DC | PRN

## 2014-11-09 MED ORDER — SODIUM CHLORIDE 0.9 % IV BOLUS (SEPSIS)
1000.0000 mL | Freq: Once | INTRAVENOUS | Status: AC
  Administered 2014-11-09: 1000 mL via INTRAVENOUS

## 2014-11-09 MED ORDER — FENTANYL CITRATE 0.05 MG/ML IJ SOLN
INTRAMUSCULAR | Status: AC
  Filled 2014-11-09: qty 5

## 2014-11-09 MED ORDER — DEXAMETHASONE SODIUM PHOSPHATE 10 MG/ML IJ SOLN
INTRAMUSCULAR | Status: DC | PRN
  Administered 2014-11-09: 10 mg via INTRAVENOUS

## 2014-11-09 MED ORDER — HYDROMORPHONE HCL 1 MG/ML IJ SOLN
INTRAMUSCULAR | Status: AC
  Filled 2014-11-09: qty 1

## 2014-11-09 MED ORDER — STERILE WATER FOR IRRIGATION IR SOLN
Status: DC | PRN
  Administered 2014-11-09: 3000 mL

## 2014-11-09 MED ORDER — PROPOFOL 10 MG/ML IV BOLUS
INTRAVENOUS | Status: DC | PRN
  Administered 2014-11-09: 150 mg via INTRAVENOUS

## 2014-11-09 MED ORDER — MORPHINE SULFATE 2 MG/ML IJ SOLN: 2.0000 mg | INTRAMUSCULAR | Status: DC | PRN

## 2014-11-09 MED ORDER — LACTATED RINGERS IV SOLN
INTRAVENOUS | Status: DC | PRN
  Administered 2014-11-09: 11:00:00

## 2014-11-09 MED ORDER — SODIUM CHLORIDE 0.9 % IJ SOLN
3.0000 mL | Freq: Two times a day (BID) | INTRAMUSCULAR | Status: DC
  Administered 2014-11-10: 3 mL via INTRAVENOUS

## 2014-11-09 MED ORDER — ONDANSETRON HCL 4 MG/2ML IJ SOLN
INTRAMUSCULAR | Status: AC
  Filled 2014-11-09: qty 2

## 2014-11-09 MED ORDER — SENNOSIDES-DOCUSATE SODIUM 8.6-50 MG PO TABS
2.0000 | ORAL_TABLET | Freq: Every day | ORAL | Status: DC
  Administered 2014-11-09: 2 via ORAL
  Filled 2014-11-09 (×2): qty 2

## 2014-11-09 MED ORDER — CIPROFLOXACIN HCL 500 MG PO TABS: 500.0000 mg | ORAL_TABLET | Freq: Two times a day (BID) | ORAL | Status: DC

## 2014-11-09 MED ORDER — PROPOFOL 10 MG/ML IV BOLUS
INTRAVENOUS | Status: AC
Start: 2014-11-09 — End: 2014-11-09
  Filled 2014-11-09: qty 20

## 2014-11-09 MED ORDER — HYDROMORPHONE HCL 1 MG/ML IJ SOLN
INTRAMUSCULAR | Status: DC | PRN
  Administered 2014-11-09 (×4): 0.5 mg via INTRAVENOUS

## 2014-11-09 MED ORDER — LIDOCAINE HCL (CARDIAC) 20 MG/ML IV SOLN
INTRAVENOUS | Status: DC | PRN
  Administered 2014-11-09: 100 mg via INTRAVENOUS

## 2014-11-09 MED ORDER — SUCCINYLCHOLINE CHLORIDE 20 MG/ML IJ SOLN
INTRAMUSCULAR | Status: DC | PRN
  Administered 2014-11-09: 100 mg via INTRAVENOUS

## 2014-11-09 MED ORDER — DEXAMETHASONE SODIUM PHOSPHATE 10 MG/ML IJ SOLN
INTRAMUSCULAR | Status: AC
  Filled 2014-11-09: qty 1

## 2014-11-09 MED ORDER — NEOSTIGMINE METHYLSULFATE 10 MG/10ML IV SOLN
INTRAVENOUS | Status: DC | PRN
  Administered 2014-11-09: 5 mg via INTRAVENOUS

## 2014-11-09 MED ORDER — BACITRACIN-NEOMYCIN-POLYMYXIN 400-5-5000 EX OINT: 1.0000 "application " | TOPICAL_OINTMENT | Freq: Three times a day (TID) | CUTANEOUS | Status: DC | PRN

## 2014-11-09 MED ORDER — FENTANYL CITRATE 0.05 MG/ML IJ SOLN
INTRAMUSCULAR | Status: AC
  Filled 2014-11-09: qty 2

## 2014-11-09 MED ORDER — SODIUM CHLORIDE 0.9 % IJ SOLN: 3.0000 mL | INTRAMUSCULAR | Status: DC | PRN

## 2014-11-09 MED ORDER — SODIUM CHLORIDE 0.9 % IR SOLN
Status: DC | PRN
  Administered 2014-11-09: 1000 mL

## 2014-11-09 MED ORDER — CEFAZOLIN SODIUM-DEXTROSE 2-3 GM-% IV SOLR
2.0000 g | INTRAVENOUS | Status: AC
  Administered 2014-11-09: 2 g via INTRAVENOUS

## 2014-11-09 MED ORDER — ONDANSETRON HCL 4 MG/2ML IJ SOLN
INTRAMUSCULAR | Status: DC | PRN
  Administered 2014-11-09: 4 mg via INTRAVENOUS

## 2014-11-09 MED ORDER — MIDAZOLAM HCL 2 MG/2ML IJ SOLN
INTRAMUSCULAR | Status: AC
  Filled 2014-11-09: qty 2

## 2014-11-09 MED ORDER — KETOROLAC TROMETHAMINE 15 MG/ML IJ SOLN: 15.0000 mg | Freq: Four times a day (QID) | INTRAMUSCULAR | Status: DC

## 2014-11-09 MED ORDER — OXYBUTYNIN CHLORIDE 5 MG PO TABS
5.0000 mg | ORAL_TABLET | Freq: Three times a day (TID) | ORAL | Status: DC | PRN
  Filled 2014-11-09: qty 1

## 2014-11-09 MED ORDER — CEFAZOLIN SODIUM-DEXTROSE 2-3 GM-% IV SOLR
INTRAVENOUS | Status: AC
  Filled 2014-11-09: qty 50

## 2014-11-09 MED ORDER — GLYCOPYRROLATE 0.2 MG/ML IJ SOLN
INTRAMUSCULAR | Status: DC | PRN
  Administered 2014-11-09: 0.6 mg via INTRAVENOUS

## 2014-11-09 MED ORDER — LIDOCAINE HCL (CARDIAC) 20 MG/ML IV SOLN
INTRAVENOUS | Status: AC
  Filled 2014-11-09: qty 5

## 2014-11-09 MED ORDER — SODIUM CHLORIDE 0.9 % IV SOLN
INTRAVENOUS | Status: DC
  Administered 2014-11-09 (×2): via INTRAVENOUS

## 2014-11-09 MED ORDER — FENTANYL CITRATE 0.05 MG/ML IJ SOLN
INTRAMUSCULAR | Status: DC | PRN
  Administered 2014-11-09 (×5): 50 ug via INTRAVENOUS
  Administered 2014-11-09: 100 ug via INTRAVENOUS
  Administered 2014-11-09: 50 ug via INTRAVENOUS

## 2014-11-09 MED ORDER — HYDROMORPHONE HCL 1 MG/ML IJ SOLN
0.2500 mg | INTRAMUSCULAR | Status: DC | PRN
  Administered 2014-11-09 (×2): 0.5 mg via INTRAVENOUS

## 2014-11-09 MED ORDER — GLYCOPYRROLATE 0.2 MG/ML IJ SOLN
INTRAMUSCULAR | Status: AC
  Filled 2014-11-09: qty 3

## 2014-11-09 MED ORDER — HYDROCODONE-ACETAMINOPHEN 5-325 MG PO TABS: 1.0000 | ORAL_TABLET | Freq: Four times a day (QID) | ORAL | Status: DC | PRN

## 2014-11-09 MED ORDER — BUPIVACAINE-EPINEPHRINE 0.25% -1:200000 IJ SOLN
INTRAMUSCULAR | Status: DC | PRN
  Administered 2014-11-09: 30 mL

## 2014-11-09 MED ORDER — DIPHENHYDRAMINE HCL 12.5 MG/5ML PO ELIX: 12.5000 mg | ORAL_SOLUTION | Freq: Four times a day (QID) | ORAL | Status: DC | PRN

## 2014-11-09 MED ORDER — ROCURONIUM BROMIDE 100 MG/10ML IV SOLN
INTRAVENOUS | Status: DC | PRN
  Administered 2014-11-09: 5 mg via INTRAVENOUS
  Administered 2014-11-09: 10 mg via INTRAVENOUS
  Administered 2014-11-09: 50 mg via INTRAVENOUS

## 2014-11-09 MED ORDER — ONDANSETRON HCL 4 MG/2ML IJ SOLN
4.0000 mg | INTRAMUSCULAR | Status: DC | PRN
  Administered 2014-11-09: 4 mg via INTRAVENOUS
  Filled 2014-11-09 (×2): qty 2

## 2014-11-09 MED ORDER — LACTATED RINGERS IV SOLN
INTRAVENOUS | Status: DC
  Administered 2014-11-09: 12:00:00 via INTRAVENOUS
  Administered 2014-11-09: 1000 mL via INTRAVENOUS

## 2014-11-09 MED ORDER — DIPHENHYDRAMINE HCL 50 MG/ML IJ SOLN: 12.5000 mg | Freq: Four times a day (QID) | INTRAMUSCULAR | Status: DC | PRN

## 2014-11-09 MED ORDER — HYDROMORPHONE HCL 2 MG/ML IJ SOLN
INTRAMUSCULAR | Status: AC
  Filled 2014-11-09: qty 1

## 2014-11-09 MED ORDER — BUPIVACAINE-EPINEPHRINE (PF) 0.25% -1:200000 IJ SOLN
INTRAMUSCULAR | Status: AC
  Filled 2014-11-09: qty 30

## 2014-11-09 MED ORDER — DOCUSATE SODIUM 100 MG PO CAPS: 100.0000 mg | ORAL_CAPSULE | Freq: Two times a day (BID) | ORAL | Status: DC

## 2014-11-09 MED ORDER — NEOSTIGMINE METHYLSULFATE 10 MG/10ML IV SOLN
INTRAVENOUS | Status: AC
  Filled 2014-11-09: qty 1

## 2014-11-09 MED ORDER — OXYCODONE-ACETAMINOPHEN 5-325 MG PO TABS: 1.0000 | ORAL_TABLET | ORAL | Status: DC | PRN

## 2014-11-09 SURGICAL SUPPLY — 46 items
CABLE HIGH FREQUENCY MONO STRZ (ELECTRODE) ×2 IMPLANT
CANISTER SUCTION 2500CC (MISCELLANEOUS) ×1 IMPLANT
CATH FOLEY 2WAY SLVR 18FR 30CC (CATHETERS) ×2 IMPLANT
CATH ROBINSON RED A/P 16FR (CATHETERS) ×2 IMPLANT
CATH ROBINSON RED A/P 8FR (CATHETERS) ×2 IMPLANT
CATH TIEMANN FOLEY 18FR 5CC (CATHETERS) ×2 IMPLANT
CHLORAPREP W/TINT 26ML (MISCELLANEOUS) ×2 IMPLANT
CLIP LIGATING HEM O LOK PURPLE (MISCELLANEOUS) ×2 IMPLANT
CLOTH BEACON ORANGE TIMEOUT ST (SAFETY) ×2 IMPLANT
COVER SURGICAL LIGHT HANDLE (MISCELLANEOUS) ×2 IMPLANT
COVER TIP SHEARS 8 DVNC (MISCELLANEOUS) ×1 IMPLANT
COVER TIP SHEARS 8MM DA VINCI (MISCELLANEOUS) ×1
CUTTER ECHEON FLEX ENDO 45 340 (ENDOMECHANICALS) ×2 IMPLANT
DECANTER SPIKE VIAL GLASS SM (MISCELLANEOUS) ×1 IMPLANT
DRAPE SURG IRRIG POUCH 19X23 (DRAPES) ×2 IMPLANT
DRSG TEGADERM 4X4.75 (GAUZE/BANDAGES/DRESSINGS) ×2 IMPLANT
DRSG TEGADERM 6X8 (GAUZE/BANDAGES/DRESSINGS) ×4 IMPLANT
ELECT REM PT RETURN 9FT ADLT (ELECTROSURGICAL) ×2
ELECTRODE REM PT RTRN 9FT ADLT (ELECTROSURGICAL) ×1 IMPLANT
GLOVE BIO SURGEON STRL SZ 6.5 (GLOVE) ×2 IMPLANT
GLOVE BIOGEL M STRL SZ7.5 (GLOVE) ×4 IMPLANT
GOWN STRL REUS W/TWL LRG LVL3 (GOWN DISPOSABLE) ×8 IMPLANT
HOLDER FOLEY CATH W/STRAP (MISCELLANEOUS) ×2 IMPLANT
KIT ACCESSORY DA VINCI DISP (KITS) ×1
KIT ACCESSORY DVNC DISP (KITS) ×1 IMPLANT
LIQUID BAND (GAUZE/BANDAGES/DRESSINGS) ×1 IMPLANT
MANIFOLD NEPTUNE II (INSTRUMENTS) ×1 IMPLANT
NDL SAFETY ECLIPSE 18X1.5 (NEEDLE) ×1 IMPLANT
NEEDLE HYPO 18GX1.5 SHARP (NEEDLE) ×2
PACK ROBOT UROLOGY CUSTOM (CUSTOM PROCEDURE TRAY) ×2 IMPLANT
PEN SKIN MARKING BROAD (MISCELLANEOUS) ×1 IMPLANT
RELOAD GREEN ECHELON 45 (STAPLE) ×2 IMPLANT
SET TUBE IRRIG SUCTION NO TIP (IRRIGATION / IRRIGATOR) ×2 IMPLANT
SOLUTION ELECTROLUBE (MISCELLANEOUS) ×2 IMPLANT
SUT ETHILON 3 0 PS 1 (SUTURE) ×2 IMPLANT
SUT MNCRL 3 0 RB1 (SUTURE) ×1 IMPLANT
SUT MNCRL 3 0 VIOLET RB1 (SUTURE) ×1 IMPLANT
SUT MNCRL AB 4-0 PS2 18 (SUTURE) ×4 IMPLANT
SUT MONOCRYL 3 0 RB1 (SUTURE) ×2
SUT VIC AB 0 CT1 27 (SUTURE) ×4
SUT VIC AB 0 CT1 27XBRD ANTBC (SUTURE) ×1 IMPLANT
SUT VIC AB 2-0 SH 27 (SUTURE) ×2
SUT VIC AB 2-0 SH 27X BRD (SUTURE) ×1 IMPLANT
SUT VICRYL 0 UR6 27IN ABS (SUTURE) ×4 IMPLANT
SYR 27GX1/2 1ML LL SAFETY (SYRINGE) ×2 IMPLANT
TOWEL OR NON WOVEN STRL DISP B (DISPOSABLE) ×2 IMPLANT

## 2014-11-09 NOTE — Interval H&P Note (Signed)
History and Physical Interval Note:  11/09/2014 9:36 AM  Terry Davenport  has presented today for surgery, with the diagnosis of PROSTATE CANCER  The various methods of treatment have been discussed with the patient and family. After consideration of risks, benefits and other options for treatment, the patient has consented to  Procedure(s): ROBOTIC ASSISTED LAPAROSCOPIC RADICAL PROSTATECTOMY LEVEL 1 (N/A) as a surgical intervention .  The patient's history has been reviewed, patient examined, no change in status, stable for surgery.  I have reviewed the patient's chart and labs.  Questions were answered to the patient's satisfaction.     Conroy Goracke,LES

## 2014-11-09 NOTE — Progress Notes (Signed)
Patient ID: Terry Davenport, male   DOB: December 04, 1956, 58 y.o.   MRN: [id number]  Post-op note  Subjective: The patient is doing well.  No complaints.  Objective: Vital signs in last 24 hours: Temp:  [97.6 F (36.4 C)-98.3 F (36.8 C)] 97.6 F (36.4 C) (11/16 1524) Pulse Rate:  [59-92] 64 (11/16 1524) Resp:  [7-16] 14 (11/16 1524) BP: (132-147)/(68-84) 139/73 mmHg (11/16 1524) SpO2:  [100 %] 100 % (11/16 1524) Weight:  [75.751 kg (167 lb)] 75.751 kg (167 lb) (11/16 0811)  Intake/Output from previous day:   Intake/Output this shift: Total I/O In: 2900 [I.V.:2900] Out: 355 [Urine:250; Drains:30; Blood:75]  Physical Exam:  General: Alert and oriented. Abdomen: Soft, Nondistended. Incisions: Clean and dry.  Lab Results:  Recent Labs  11/09/14 1346  HGB 14.7  HCT 42.5    Assessment/Plan: POD#0   1) Continue to monitor   Pryor Curia. MD   LOS: 0 days   Terry Davenport,Terry Davenport 11/09/2014, 5:07 PM

## 2014-11-09 NOTE — Anesthesia Postprocedure Evaluation (Signed)
  Anesthesia Post-op Note  Patient: Terry Davenport  Procedure(s) Performed: Procedure(s) (LRB): ROBOTIC ASSISTED LAPAROSCOPIC RADICAL PROSTATECTOMY LEVEL 1 (N/A)  Patient Location: PACU  Anesthesia Type: General  Level of Consciousness: awake and alert   Airway and Oxygen Therapy: Patient Spontanous Breathing  Post-op Pain: mild  Post-op Assessment: Post-op Vital signs reviewed, Patient's Cardiovascular Status Stable, Respiratory Function Stable, Patent Airway and No signs of Nausea or vomiting  Last Vitals:  Filed Vitals:   11/09/14 1309  BP:   Pulse: 71  Temp: 36.7 C  Resp: 12    Post-op Vital Signs: stable   Complications: No apparent anesthesia complications

## 2014-11-09 NOTE — Plan of Care (Signed)
Problem: Consults Goal: Radical Robotic Prostatectomy Patient Education Outcome: Progressing Goal: Skin Care Protocol Initiated - if Braden Score 18 or less If consults are not indicated, leave blank or document N/A  Outcome: Not Applicable Date Met:  18/56/31 Goal: Nutrition Consult-if indicated Outcome: Not Applicable Date Met:  49/70/26 Goal: Diabetes Guidelines if Diabetic/Glucose > 140 If diabetic or lab glucose is > 140 mg/dl - Initiate Diabetes/Hyperglycemia Guidelines & Document Interventions  Outcome: Not Applicable Date Met:  37/85/88  Problem: Phase I Progression Outcomes Goal: Pain controlled with appropriate interventions Outcome: Completed/Met Date Met:  11/09/14 Goal: NPO except ice chips or as ordered Outcome: Not Applicable Date Met:  50/27/74 Goal: Foley/JP patent Outcome: Completed/Met Date Met:  11/09/14 Goal: Incision/dressing intact Outcome: Completed/Met Date Met:  11/09/14 Goal: Adequate I & O Outcome: Progressing Goal: Walk in halls when awake from anesthesia Outcome: Completed/Met Date Met:  11/09/14 Goal: Initial discharge plan identified Outcome: Completed/Met Date Met:  11/09/14 Goal: Hemodynamically stable Outcome: Progressing Goal: Other Phase I Outcomes/Goals Outcome: Not Applicable Date Met:  12/87/86

## 2014-11-09 NOTE — Anesthesia Preprocedure Evaluation (Addendum)
Anesthesia Evaluation  Patient identified by MRN, date of birth, ID band Patient awake    Reviewed: Allergy & Precautions, H&P , NPO status , Patient's Chart, lab work & pertinent test results  Airway Mallampati: II  TM Distance: >3 FB Neck ROM: full    Dental no notable dental hx. (+) Teeth Intact, Dental Advisory Given   Pulmonary neg pulmonary ROS,  breath sounds clear to auscultation  Pulmonary exam normal       Cardiovascular Exercise Tolerance: Good negative cardio ROS  Rhythm:regular Rate:Normal     Neuro/Psych negative neurological ROS  negative psych ROS   GI/Hepatic negative GI ROS, Neg liver ROS,   Endo/Other  negative endocrine ROS  Renal/GU negative Renal ROS  negative genitourinary   Musculoskeletal   Abdominal   Peds  Hematology negative hematology ROS (+)   Anesthesia Other Findings   Reproductive/Obstetrics negative OB ROS                            Anesthesia Physical Anesthesia Plan  ASA: II  Anesthesia Plan: General   Post-op Pain Management:    Induction: Intravenous  Airway Management Planned: Oral ETT  Additional Equipment:   Intra-op Plan:   Post-operative Plan: Extubation in OR  Informed Consent: I have reviewed the patients History and Physical, chart, labs and discussed the procedure including the risks, benefits and alternatives for the proposed anesthesia with the patient or authorized representative who has indicated his/her understanding and acceptance.   Dental Advisory Given  Plan Discussed with: CRNA and Surgeon  Anesthesia Plan Comments:        Anesthesia Quick Evaluation  

## 2014-11-09 NOTE — Transfer of Care (Signed)
Immediate Anesthesia Transfer of Care Note  Patient: Terry Davenport  Procedure(s) Performed: Procedure(s) (LRB): ROBOTIC ASSISTED LAPAROSCOPIC RADICAL PROSTATECTOMY LEVEL 1 (N/A)  Patient Location: PACU  Anesthesia Type: General  Level of Consciousness: sedated, patient cooperative and responds to stimulation  Airway & Oxygen Therapy: Patient Spontanous Breathing and Patient connected to face mask oxgen  Post-op Assessment: Report given to PACU RN and Post -op Vital signs reviewed and stable  Post vital signs: Reviewed and stable  Complications: No apparent anesthesia complications

## 2014-11-09 NOTE — Op Note (Signed)
Preoperative diagnosis: Clinically localized adenocarcinoma of the prostate (clinical stage T1c Nx Mx)  Postoperative diagnosis: Clinically localized adenocarcinoma of the prostate (clinical stage T1c Nx Mx)  Procedure:  1. Robotic assisted laparoscopic radical prostatectomy (bilateral nerve sparing)  Surgeon: Roxy Horseman, Brooke Bonito. M.D.  Assistant: Dr. Curt Bears  Anesthesia: General  Complications: None  EBL: 100 mL  IVF:  2000 mL crystalloid  Specimens: 1. Prostate and seminal vesicles  Disposition of specimens: Pathology  Drains: 1. 20 Fr coude catheter 2. # 19 Blake pelvic drain  Indication: Terry Davenport is a 58 y.o. year old patient with clinically localized prostate cancer.  After a thorough review of the management options for treatment of prostate cancer, he elected to proceed with surgical therapy and the above procedure(s).  We have discussed the potential benefits and risks of the procedure, side effects of the proposed treatment, the likelihood of the patient achieving the goals of the procedure, and any potential problems that might occur during the procedure or recuperation. Informed consent has been obtained.  Description of procedure:  The patient was taken to the operating room and a general anesthetic was administered. He was given preoperative antibiotics, placed in the dorsal lithotomy position, and prepped and draped in the usual sterile fashion. Next a preoperative timeout was performed. A urethral catheter was placed into the bladder and a site was selected near the umbilicus for placement of the camera port. This was placed using a standard open Hassan technique which allowed entry into the peritoneal cavity under direct vision and without difficulty. A 12 mm port was placed and a pneumoperitoneum established. The camera was then used to inspect the abdomen and there was no evidence of any intra-abdominal injuries or other abnormalities. The remaining  abdominal ports were then placed. 8 mm robotic ports were placed in the right lower quadrant, left lower quadrant, and far left lateral abdominal wall. A 5 mm port was placed in the right upper quadrant and a 12 mm port was placed in the right lateral abdominal wall for laparoscopic assistance. All ports were placed under direct vision without difficulty. The surgical cart was then docked.   Utilizing the cautery scissors, the bladder was reflected posteriorly allowing entry into the space of Retzius and identification of the endopelvic fascia and prostate. The periprostatic fat was then removed from the prostate allowing full exposure of the endopelvic fascia. The endopelvic fascia was then incised from the apex back to the base of the prostate bilaterally and the underlying levator muscle fibers were swept laterally off the prostate thereby isolating the dorsal venous complex. The dorsal vein was then stapled and divided with a 45 mm Flex Echelon stapler. Attention then turned to the bladder neck which was divided anteriorly thereby allowing entry into the bladder and exposure of the urethral catheter. The catheter balloon was deflated and the catheter was brought into the operative field and used to retract the prostate anteriorly. The posterior bladder neck was then examined and was divided allowing further dissection between the bladder and prostate posteriorly until the vasa deferentia and seminal vessels were identified. The vasa deferentia were isolated, divided, and lifted anteriorly. The seminal vesicles were dissected down to their tips with care to control the seminal vascular arterial blood supply. These structures were then lifted anteriorly and the space between Denonvillier's fascia and the anterior rectum was developed with a combination of sharp and blunt dissection. This isolated the vascular pedicles of the prostate.  The lateral prostatic fascia  was then sharply incised allowing release of  the neurovascular bundles bilaterally. The vascular pedicles of the prostate were then ligated with Weck clips between the prostate and neurovascular bundles and divided with sharp cold scissor dissection resulting in neurovascular bundle preservation. The neurovascular bundles were then separated off the apex of the prostate and urethra bilaterally.  The urethra was then sharply transected allowing the prostate specimen to be disarticulated. The pelvis was copiously irrigated and hemostasis was ensured. There was no evidence for rectal injury.  Attention then turned to the urethral anastomosis. A 2-0 Vicryl slip knot was placed between Denonvillier's fascia, the posterior bladder neck, and the posterior urethra to reapproximate these structures. A double-armed 3-0 Monocryl suture was then used to perform a 360 running tension-free anastomosis between the bladder neck and urethra. A new urethral catheter was then placed into the bladder and irrigated. There were no blood clots within the bladder and the anastomosis appeared to be watertight. A #19 Blake drain was then brought through the left lateral 8 mm port site and positioned appropriately within the pelvis. It was secured to the skin with a nylon suture. The surgical cart was then undocked. The right lateral 12 mm port site was closed at the fascial level with a 0 Vicryl suture placed laparoscopically. All remaining ports were then removed under direct vision. The prostate specimen was removed intact within the Endopouch retrieval bag via the periumbilical camera port site. This fascial opening was closed with two running 0 Vicryl sutures. 0.25% Marcaine was then injected into all port sites and all incisions were reapproximated at the skin level with 4-0 Monocryl subcuticular sutures and Dermabond. The patient appeared to tolerate the procedure well and without complications. The patient was able to be extubated and transferred to the recovery unit in  satisfactory condition.  Pryor Curia MD

## 2014-11-09 NOTE — Discharge Instructions (Signed)

## 2014-11-10 ENCOUNTER — Encounter (HOSPITAL_COMMUNITY): Payer: Self-pay | Admitting: Urology

## 2014-11-10 LAB — HEMOGLOBIN AND HEMATOCRIT, BLOOD
HCT: 38.2 % — ABNORMAL LOW (ref 39.0–52.0)
HEMOGLOBIN: 12.7 g/dL — AB (ref 13.0–17.0)

## 2014-11-10 MED ORDER — BISACODYL 10 MG RE SUPP
10.0000 mg | Freq: Once | RECTAL | Status: AC
  Administered 2014-11-10: 10 mg via RECTAL
  Filled 2014-11-10: qty 1

## 2014-11-10 MED ORDER — HYDROCODONE-ACETAMINOPHEN 5-325 MG PO TABS
1.0000 | ORAL_TABLET | Freq: Four times a day (QID) | ORAL | Status: DC | PRN
Start: 2014-11-10 — End: 2014-11-10

## 2014-11-10 NOTE — Progress Notes (Signed)
Patient ID: Terry Davenport, male   DOB: 1956/01/28, 58 y.o.   MRN: [id number]  1 Day Post-Op Subjective: The patient is doing well.  No nausea or vomiting. Pain is adequately controlled.  Objective: Vital signs in last 24 hours: Temp:  [97.4 F (36.3 C)-98.4 F (36.9 C)] 98.4 F (36.9 C) (11/17 0453) Pulse Rate:  [58-92] 58 (11/17 0453) Resp:  [7-16] 14 (11/17 0453) BP: (113-147)/(54-84) 113/67 mmHg (11/17 0453) SpO2:  [99 %-100 %] 100 % (11/17 0453) Weight:  [75.751 kg (167 lb)] 75.751 kg (167 lb) (11/16 0811)  Intake/Output from previous day: 11/16 0701 - 11/17 0700 In: 5060 [P.O.:960; I.V.:4100] Out: 2200 [Urine:2050; Drains:75; Blood:75] Intake/Output this shift:    Physical Exam:  General: Alert and oriented. CV: RRR Lungs: Clear bilaterally. GI: Soft, Nondistended. Incisions: Clean, dry, and intact Urine: Clear Extremities: Nontender, no erythema, no edema.  Lab Results:  Recent Labs  11/09/14 1346 11/10/14 0500  HGB 14.7 12.7*  HCT 42.5 38.2*      Assessment/Plan: POD# 1 s/p robotic prostatectomy.  1) SL IVF 2) Ambulate, Incentive spirometry 3) Transition to oral pain medication 4) Dulcolax suppository 5) D/C pelvic drain 6) Plan for likely discharge later today   Pryor Curia. MD   LOS: 1 day   Myonna Chisom,LES 11/10/2014, 7:02 AM

## 2014-11-10 NOTE — Discharge Summary (Signed)
  Date of admission: 11/09/2014  Date of discharge: 11/10/2014  Admission diagnosis: Prostate Cancer  Discharge diagnosis: Prostate Cancer  History and Physical: For full details, please see admission history and physical. Briefly, Terry Davenport is a 58 y.o. gentleman with localized prostate cancer.  After discussing management/treatment options, he elected to proceed with surgical treatment.  Hospital Course: Terry Davenport was taken to the operating room on 11/09/2014 and underwent a robotic assisted laparoscopic radical prostatectomy. He tolerated this procedure well and without complications. Postoperatively, he was able to be transferred to a regular hospital room following recovery from anesthesia.  He was able to begin ambulating the night of surgery. He remained hemodynamically stable overnight.  He had excellent urine output with appropriately minimal output from his pelvic drain and his pelvic drain was removed on POD #1.  He was transitioned to oral pain medication, tolerated a clear liquid diet, and had met all discharge criteria and was able to be discharged home later on POD#1.  Laboratory values:  Recent Labs  11/09/14 1346 11/10/14 0500  HGB 14.7 12.7*  HCT 42.5 38.2*    Disposition: Home  Discharge instruction: He was instructed to be ambulatory but to refrain from heavy lifting, strenuous activity, or driving. He was instructed on urethral catheter care.  Discharge medications:     Medication List    TAKE these medications        ciprofloxacin 500 MG tablet  Commonly known as:  CIPRO  Take 1 tablet (500 mg total) by mouth 2 (two) times daily. Begin one day prior to return office visit for catheter removal.     clobetasol 0.05 % Gel  Commonly known as:  TEMOVATE  Apply 1 application topically 2 (two) times daily.     docusate sodium 100 MG capsule  Commonly known as:  COLACE  Take 1 capsule (100 mg total) by mouth 2 (two) times daily.     HYDROcodone-acetaminophen 5-325 MG per tablet  Commonly known as:  NORCO/VICODIN  Take 1-2 tablets by mouth every 6 (six) hours as needed.     multivitamin with minerals Tabs tablet  Take 1 tablet by mouth daily.     vitamin C 1000 MG tablet  Take 1,000 mg by mouth daily.        Followup: He will followup in 1 week for catheter removal and to discuss his surgical pathology results.

## 2014-11-10 NOTE — Plan of Care (Signed)
Problem: Consults Goal: Radical Robotic Prostatectomy Patient Education Outcome: Completed/Met Date Met:  11/10/14  Problem: Phase I Progression Outcomes Goal: Adequate I & O Outcome: Completed/Met Date Met:  11/10/14 Goal: Hemodynamically stable Outcome: Completed/Met Date Met:  11/10/14  Problem: Phase II Progression Outcomes Goal: Pain controlled Outcome: Completed/Met Date Met:  11/10/14 Goal: Ambulate in halls 4-6 x day Outcome: Completed/Met Date Met:  11/10/14 Goal: Discharge plan established Outcome: Completed/Met Date Met:  11/10/14 Goal: Tolerates clear liquids POD #1 Outcome: Completed/Met Date Met:  11/10/14 Goal: Vital signs stable Outcome: Completed/Met Date Met:  11/10/14 Goal: Other Phase II Outcomes/Goals Outcome: Completed/Met Date Met:  11/10/14  Problem: Discharge Progression Outcomes Goal: Barriers To Progression Addressed/Resolved Outcome: Completed/Met Date Met:  11/10/14 Goal: Discharge plan in place and appropriate Outcome: Completed/Met Date Met:  11/10/14 Goal: Pain controlled with appropriate interventions Outcome: Completed/Met Date Met:  11/10/14 Goal: Hemodynamically stable Outcome: Completed/Met Date Met:  86/16/83 Goal: Complications resolved/controlled Outcome: Completed/Met Date Met:  11/10/14 Goal: Tolerating diet Outcome: Completed/Met Date Met:  11/10/14 Goal: Walks without assist or return to baseline Outcome: Completed/Met Date Met:  11/10/14 Goal: Foley patent; no bleeding Outcome: Completed/Met Date Met:  11/10/14 Goal: Foley to leg bag Outcome: Completed/Met Date Met:  11/10/14 Goal: Follow up appointment(s) in place Outcome: Completed/Met Date Met:  11/10/14 Goal: Patient given 2 leg bags Outcome: Completed/Met Date Met:  11/10/14 Goal: Patient given clean overnight foley bag Outcome: Completed/Met Date Met:  11/10/14 Goal: Patient given 4x4 gauze/tape Outcome: Completed/Met Date Met:  11/10/14 Goal: Other Discharge  Outcomes/Goals Outcome: Completed/Met Date Met:  11/10/14

## 2015-01-22 ENCOUNTER — Encounter (HOSPITAL_COMMUNITY): Payer: Self-pay | Admitting: *Deleted

## 2015-01-22 ENCOUNTER — Other Ambulatory Visit: Payer: Self-pay | Admitting: Urology

## 2015-01-22 NOTE — Progress Notes (Signed)
Called Dr. Lynne Logan office requested release of orders for Same day surgery Monday, February 12-2014 Thanks

## 2015-01-23 NOTE — H&P (Signed)
History of Present Illness Terry Davenport is 59 years old with prostate cancer s/p a BNS RAL radical prostatectomy on 11/09/14.    Diagnosis: pT2c Nx Mx, Gleason 3+4=7 adenocarcinoma with negative surgical margins  Pretreatment PSA: 4.5  Pretreatment SHIM: 24    Interval history:    He follows up today and states that he is urinary stream has gone progressively weaker over the past few days. It now is fairly thin stream and he is having some deflection of his stream again. He has been able to empty his bladder.   Past Medical History Problems  1. History of No significant past medical history  Surgical History Problems  1. History of Prostatect Retropubic Radical W/ Nerve Sparing Laparoscopic  Current Meds 1. Betamethasone Dipropionate 0.05 % External Cream; Apply as directed to affected area  twice daily for 2 weeks;  Therapy: 22Dec2015 to (Last Rx:22Dec2015)  Requested for: 22Dec2015 Ordered 2. Multi-Vitamin TABS;  Therapy: (Recorded:04Jun2015) to Recorded 3. Vitamin C TABS;  Therapy: (Recorded:13Jul2015) to Recorded  Allergies Medication  1. Sulfa Drugs  Family History Problems  1. Family history of Emphysema/COPD : Father 2. Family history of chronic obstructive pulmonary disease (Z82.5) : Father 3. Family history of hypertension (Z82.49) : Mother 4. Family history of malignant neoplasm of breast (Z80.3) : Mother  Social History Problems  1. Activities of daily living (ADL's), independent 2. Alcohol use (F10.99)   <1 a day 3. Exercise habits   No formal exercise at the gym however he indicates he walks systolic for 30 minutes on     a daily basis and is independent with all chores. 4. Married 5. Never a smoker 6. Occupation   Chief Financial Officer at [employer] Includes: Last 1 Day]  Recorded: 29Jan2016 12:23PM  Height: 6 ft 1 in Weight: 156 lb  BMI Calculated: 20.58 BSA Calculated: 1.94 Blood Pressure: 157 / 74 Heart Rate:  60  Physical Exam Constitutional: Well nourished and well developed . No acute distress.  ENT:. The ears and nose are normal in appearance.  Neck: The appearance of the neck is normal and no neck mass is present.  Pulmonary: No respiratory distress and normal respiratory rhythm and effort.  Cardiovascular: Heart rate and rhythm are normal . No peripheral edema.  Genitourinary: Examination of the penis demonstrates no lesions and a normal meatus.    Results/Data Urine [Data Includes: Last 1 Day]   [id number]  COLOR YELLOW   APPEARANCE CLEAR   SPECIFIC GRAVITY <1.005   pH 6.0   GLUCOSE NEG mg/dL  BILIRUBIN NEG   KETONE NEG mg/dL  BLOOD TRACE   PROTEIN NEG mg/dL  UROBILINOGEN 0.2 mg/dL  NITRITE NEG   LEUKOCYTE ESTERASE NEG   SQUAMOUS EPITHELIAL/HPF RARE   WBC 0-2 WBC/hpf  RBC NONE SEEN RBC/hpf  BACTERIA NONE SEEN   CRYSTALS NONE SEEN   CASTS NONE SEEN    I evaluated his uroflowmetry results. He voided a total of 490 cc. His maximum flow rate was 5 cc/s with a very flat and low voiding curve. This is consistent with probable recurrent urethral stricture. PVR by ultrasound is 58 cc   Assessment Assessed  1. Urethral stricture (N35.9)  Plan Health Maintenance  1. UA With REFLEX; [Do Not Release]; Status:Complete;   Done: [id number] 12:17PM Urethral stricture  2. Complex Uroflowmetry; Status:Complete;   Done: [id number] 3. PVR U/S; Status:Complete;   Done: [id number] 4. Follow-up Office  Follow-up - will call to schedule surgery (keep follow up also)  Status:  Hold For - Date of Service  Requested for: 29Jan2016 Weak urinary stream  5. PT Follow-up Office  Follow-up  Status: Hold For - Date of Service  Requested for:  24Feb2016 12:00PM  Discussion/Summary 1. Prostate cancer: He will keep his scheduled follow-up with PSA surveillance.    2. Incontinence: This is resolved.    3. Weak stream: This appears to be likely related to recurrent urethral stricture disease.  Considering his distal stricture, we reviewed options today I recommended that we consider proceeding with radial dilation of the stricture with balloon dilation which may be less traumatic to the tissue and offer a lower chance for recurrence. We reviewed the potential risks, complications, and expected recovery process including the possible need for a postoperative catheter. He is scheduled to go out of town next week and would like to have this done as soon as possible. Therefore, this will be scheduled for next week.    4. Erectile dysfunction: He will continue penile rehabilitation following treatment of the stricture    CC:     Signatures Electronically signed by : Terry Davenport, M.D.; Jan 22 2015  5:13PM EST

## 2015-01-25 ENCOUNTER — Ambulatory Visit (HOSPITAL_COMMUNITY): Payer: BLUE CROSS/BLUE SHIELD | Admitting: Anesthesiology

## 2015-01-25 ENCOUNTER — Encounter (HOSPITAL_COMMUNITY)
Admission: RE | Disposition: A | Discharge status: Home / Self Care | Payer: Self-pay | Source: Ambulatory Visit | Attending: Urology

## 2015-01-25 ENCOUNTER — Encounter (HOSPITAL_COMMUNITY): Payer: Self-pay | Admitting: *Deleted

## 2015-01-25 ENCOUNTER — Ambulatory Visit (HOSPITAL_COMMUNITY)
Admission: RE | Admit: 2015-01-25 | Discharge: 2015-01-25 | Disposition: A | Discharge status: Home / Self Care | Payer: BLUE CROSS/BLUE SHIELD | Source: Ambulatory Visit | Attending: Urology | Admitting: Urology

## 2015-01-25 DIAGNOSIS — N359 Urethral stricture, unspecified: Secondary | ICD-10-CM | POA: Insufficient documentation

## 2015-01-25 DIAGNOSIS — Z8546 Personal history of malignant neoplasm of prostate: Secondary | ICD-10-CM | POA: Diagnosis not present

## 2015-01-25 DIAGNOSIS — Z882 Allergy status to sulfonamides status: Secondary | ICD-10-CM | POA: Diagnosis not present

## 2015-01-25 HISTORY — PX: CYSTOSCOPY WITH URETHRAL DILATATION: SHX5125

## 2015-01-25 LAB — BASIC METABOLIC PANEL
Anion gap: 5 (ref 5–15)
BUN: 17 mg/dL (ref 6–23)
CO2: 28 mmol/L (ref 19–32)
Calcium: 8.8 mg/dL (ref 8.4–10.5)
Chloride: 106 mmol/L (ref 96–112)
Creatinine, Ser: 1 mg/dL (ref 0.50–1.35)
GFR calc Af Amer: >90 mL/min (ref >90)
GFR calc non Af Amer: 82 mL/min — ABNORMAL LOW (ref >90)
Glucose, Bld: 102 mg/dL — ABNORMAL HIGH (ref 70–99)
POTASSIUM: 4.6 mmol/L (ref 3.5–5.1)
Sodium: 139 mmol/L (ref 135–145)

## 2015-01-25 SURGERY — CYSTOSCOPY, WITH URETHRAL DILATION
Anesthesia: Monitor Anesthesia Care

## 2015-01-25 MED ORDER — DIPHENHYDRAMINE HCL 50 MG/ML IJ SOLN
INTRAMUSCULAR | Status: DC | PRN
  Administered 2015-01-25: 12.5 mg via INTRAVENOUS

## 2015-01-25 MED ORDER — STERILE WATER FOR IRRIGATION IR SOLN
Status: DC | PRN
  Administered 2015-01-25: 3000 mL

## 2015-01-25 MED ORDER — FENTANYL CITRATE 0.05 MG/ML IJ SOLN
INTRAMUSCULAR | Status: AC
  Filled 2015-01-25: qty 2

## 2015-01-25 MED ORDER — PROPOFOL 10 MG/ML IV BOLUS
INTRAVENOUS | Status: AC
  Filled 2015-01-25: qty 20

## 2015-01-25 MED ORDER — LACTATED RINGERS IV SOLN
INTRAVENOUS | Status: DC
  Administered 2015-01-25: 1000 mL via INTRAVENOUS

## 2015-01-25 MED ORDER — CIPROFLOXACIN IN D5W 400 MG/200ML IV SOLN
400.0000 mg | INTRAVENOUS | Status: AC
  Administered 2015-01-25: 400 mg via INTRAVENOUS

## 2015-01-25 MED ORDER — CIPROFLOXACIN IN D5W 400 MG/200ML IV SOLN
INTRAVENOUS | Status: AC
  Filled 2015-01-25: qty 200

## 2015-01-25 MED ORDER — KETAMINE HCL 10 MG/ML IJ SOLN
INTRAMUSCULAR | Status: AC
  Filled 2015-01-25: qty 1

## 2015-01-25 MED ORDER — MIDAZOLAM HCL 2 MG/2ML IJ SOLN
INTRAMUSCULAR | Status: AC
  Filled 2015-01-25: qty 2

## 2015-01-25 MED ORDER — LIDOCAINE HCL 2 % EX GEL
CUTANEOUS | Status: AC
  Filled 2015-01-25: qty 10

## 2015-01-25 MED ORDER — MIDAZOLAM HCL 5 MG/5ML IJ SOLN
INTRAMUSCULAR | Status: DC | PRN
  Administered 2015-01-25: 2 mg via INTRAVENOUS

## 2015-01-25 MED ORDER — PROPOFOL INFUSION 10 MG/ML OPTIME
INTRAVENOUS | Status: DC | PRN
  Administered 2015-01-25: 150 ug/kg/min via INTRAVENOUS

## 2015-01-25 MED ORDER — LIDOCAINE HCL (CARDIAC) 20 MG/ML IV SOLN
INTRAVENOUS | Status: AC
  Filled 2015-01-25: qty 5

## 2015-01-25 MED ORDER — HYDROMORPHONE HCL 1 MG/ML IJ SOLN: 0.2500 mg | INTRAMUSCULAR | Status: DC | PRN

## 2015-01-25 MED ORDER — KETAMINE HCL 10 MG/ML IJ SOLN
INTRAMUSCULAR | Status: DC | PRN
  Administered 2015-01-25: 20 mg via INTRAVENOUS
  Administered 2015-01-25: 10 mg via INTRAVENOUS

## 2015-01-25 MED ORDER — LIDOCAINE HCL 1 % IJ SOLN
INTRAMUSCULAR | Status: DC | PRN
  Administered 2015-01-25: 50 mg via INTRADERMAL

## 2015-01-25 MED ORDER — STERILE WATER FOR IRRIGATION IR SOLN
Status: DC | PRN
  Administered 2015-01-25: 500 mL

## 2015-01-25 MED ORDER — PROMETHAZINE HCL 25 MG/ML IJ SOLN: 6.2500 mg | INTRAMUSCULAR | Status: DC | PRN

## 2015-01-25 MED ORDER — LIDOCAINE HCL 2 % EX GEL
CUTANEOUS | Status: DC | PRN
  Administered 2015-01-25: 1

## 2015-01-25 MED ORDER — FENTANYL CITRATE 0.05 MG/ML IJ SOLN
INTRAMUSCULAR | Status: DC | PRN
  Administered 2015-01-25 (×2): 50 ug via INTRAVENOUS

## 2015-01-25 SURGICAL SUPPLY — 17 items
BAG URO CATCHER STRL LF (DRAPE) ×2 IMPLANT
BALLN NEPHROSTOMY (BALLOONS) ×2
BALLOON NEPHROSTOMY (BALLOONS) IMPLANT
CATH ROBINSON RED A/P 14FR (CATHETERS) ×1 IMPLANT
CLOTH BEACON ORANGE TIMEOUT ST (SAFETY) ×2 IMPLANT
DEFLUX NEEDLE (NEEDLE) IMPLANT
DEFLUX SYRINGE (SYRINGE) IMPLANT
GLOVE BIOGEL M STRL SZ7.5 (GLOVE) ×2 IMPLANT
GOWN STRL REUS W/TWL LRG LVL3 (GOWN DISPOSABLE) ×4 IMPLANT
GUIDEWIRE STR DUAL SENSOR (WIRE) ×1 IMPLANT
MANIFOLD NEPTUNE II (INSTRUMENTS) ×2 IMPLANT
NDL SAFETY ECLIPSE 18X1.5 (NEEDLE) IMPLANT
NEEDLE HYPO 18GX1.5 SHARP (NEEDLE)
PACK CYSTO (CUSTOM PROCEDURE TRAY) ×2 IMPLANT
SYR CONTROL 10ML LL (SYRINGE) IMPLANT
TUBING CONNECTING 10 (TUBING) IMPLANT
WATER STERILE IRR 3000ML UROMA (IV SOLUTION) ×2 IMPLANT

## 2015-01-25 NOTE — Op Note (Signed)
Preoperative diagnosis:  1. Distal urethral stricture  Postoperative diagnosis: 1. Urethral stricture of fossa navicularis  Procedure(s): 1. Cystoscopy 2.  Balloon dilation of urethral stricture  Surgeon: Dr. Roxy Horseman, Brooke Bonito  Anesthesia: General  Complications: None  EBL: None  Specimens:None  Indication: Terry Davenport is a 59 year old gentleman with a history of prostate cancer status post a robotic prostatectomy.  Postoperatively, he did develop a distal urethral stricture thought to be related to inflammation and possibly secondary to his indwelling catheter that he had initially after surgery.  He initially underwent serial dilation of his distal stricture in the office.  However, his symptoms recurred and findings on clinical exam were consistent with recurrence of his distal stricture.  We therefore discussed options and I recommended that he consider proceeding with balloon dilation is a potentially less traumatic treatment that might prove to be more durable with its results.  We reviewed the potential risks, complications, and expected recovery process associated with the above procedures.  He gave his informed consent to proceed.  Description of procedure:  The patient was taken to the operating room and IV sedation was administered.  He was placed in the dorsolithotomy position and prepped and draped in the usual sterile fashion.  Next, a preoperative timeout was performed.  2% lidocaine jelly was then instilled intraurethrally for local anesthesia.  The cystoscope was not able to be advanced into the distal urethra based on the location of the stricture.  I then inserted a 0.38 sensor guidewire under fluoroscopic guidance.  I then inserted the balloon dilator over the wire and was able to dilate the balloon to 24 [place].  This was left at a pressure of 18 mmHg for 5 minutes. The balloon was then deflated and removed.  I then performed flexible cystoscopy.  There were no other  abnormalities throughout the entire urethra.  The bladder neck anastomosis appeared to be wide open.  There appeared to be excellent coaptation of the urethral sphincter.  The bladder was then emptied with a 14 French catheter.  Visual inspection of the fossa navicularis did demonstrate some edema.  It was decided not to leave an indwelling catheter in order to avoid additional inflammation.  The patient tolerated the procedure well without complications.  He was able to be awakened and transferred to the recovery unit in stable condition.

## 2015-01-25 NOTE — Interval H&P Note (Signed)
History and Physical Interval Note:  01/25/2015 7:29 AM  Terry Davenport  has presented today for surgery, with the diagnosis of URETHRAL STRICTURE  The various methods of treatment have been discussed with the patient and family. After consideration of risks, benefits and other options for treatment, the patient has consented to  Procedure(s): CYSTOSCOPY WITH URETHRAL DILATATION (N/A) as a surgical intervention .  The patient's history has been reviewed, patient examined, no change in status, stable for surgery.  I have reviewed the patient's chart and labs.  Questions were answered to the patient's satisfaction.     Gredmarie Delange,LES

## 2015-01-25 NOTE — Progress Notes (Addendum)
Patient is very concerned that it took him 10 minutes to urinate after dililation of urethral stricture today. He was able to void clear yellow urine in bathroom. Bladder scanned for 129 ml after void. Called Dr Alinda Money as patient is very concerned that he will not be able to void at home. Dr Alinda Money wants patient to stay until he is able to urinate again.  1300  Patient has voided a second time. This time it was a little bit better trying to initiate urine stream; however, it was still difficult to urinate. Paged Dr Alinda Money who will come to see patient . To obtain another PVR.   80  Dr Alinda Money over to talk to patient. Patient has voided 3 times and it is easier to initiate stream each time. NO PVR.

## 2015-01-25 NOTE — Anesthesia Preprocedure Evaluation (Addendum)
Anesthesia Evaluation  Patient identified by MRN, date of birth, ID band Patient awake    Reviewed: Allergy & Precautions, H&P , NPO status , Patient's Chart, lab work & pertinent test results  Airway Mallampati: I  TM Distance: >3 FB Neck ROM: full    Dental no notable dental hx. (+) Teeth Intact, Dental Advisory Given   Pulmonary neg pulmonary ROS,  breath sounds clear to auscultation  Pulmonary exam normal       Cardiovascular Exercise Tolerance: Good negative cardio ROS  Rhythm:regular Rate:Normal     Neuro/Psych negative neurological ROS  negative psych ROS   GI/Hepatic negative GI ROS, Neg liver ROS,   Endo/Other  negative endocrine ROS  Renal/GU Hx of prostate ca     Musculoskeletal negative musculoskeletal ROS (+)   Abdominal   Peds  Hematology negative hematology ROS (+)   Anesthesia Other Findings   Reproductive/Obstetrics negative OB ROS                           Anesthesia Physical Anesthesia Plan  ASA: II  Anesthesia Plan: MAC   Post-op Pain Management:    Induction: Intravenous  Airway Management Planned:   Additional Equipment:   Intra-op Plan:   Post-operative Plan:   Informed Consent: I have reviewed the patients History and Physical, chart, labs and discussed the procedure including the risks, benefits and alternatives for the proposed anesthesia with the patient or authorized representative who has indicated his/her understanding and acceptance.   Dental Advisory Given  Plan Discussed with:   Anesthesia Plan Comments:        Anesthesia Quick Evaluation

## 2015-01-25 NOTE — Transfer of Care (Signed)
Immediate Anesthesia Transfer of Care Note  Patient: Terry Davenport  Procedure(s) Performed: Procedure(s): CYSTOSCOPY WITH URETHRAL BALLOON DILATATION (N/A)  Patient Location: PACU  Anesthesia Type:MAC  Level of Consciousness: awake, alert , oriented and patient cooperative  Airway & Oxygen Therapy: Patient Spontanous Breathing and Patient connected to face mask oxygen  Post-op Assessment: Report given to RN, Post -op Vital signs reviewed and stable and Patient moving all extremities  Post vital signs: Reviewed and stable  Last Vitals:  Filed Vitals:   01/25/15 0958  BP: 137/84  Pulse: 62  Temp:   Resp: 13    Complications: No apparent anesthesia complications

## 2015-01-25 NOTE — Anesthesia Postprocedure Evaluation (Signed)
  Anesthesia Post-op Note  Patient: Terry Davenport  Procedure(s) Performed: Procedure(s): CYSTOSCOPY WITH URETHRAL BALLOON DILATATION (N/A)  Patient Location: PACU  Anesthesia Type:MAC  Level of Consciousness: awake and alert   Airway and Oxygen Therapy: Patient Spontanous Breathing  Post-op Pain: none  Post-op Assessment: Post-op Vital signs reviewed  Post-op Vital Signs: stable  Last Vitals:  Filed Vitals:   01/25/15 1038  BP: 128/81  Pulse: 54  Temp: 36.4 C  Resp: 12    Complications: No apparent anesthesia complications

## 2015-01-25 NOTE — Discharge Instructions (Addendum)
1. You may see some blood in the urine and may have some burning with urination for 48-72 hours. You also may notice that you have to urinate more frequently or urgently after your procedure which is normal.  You should call should you develop an inability urinate, fever > 101, persistent nausea and vomiting that prevents you from eating or drinking to stay hydrated.     General Anesthesia, Care After Refer to this sheet in the next few weeks. These instructions provide you with information on caring for yourself after your procedure. Your health care provider may also give you more specific instructions. Your treatment has been planned according to current medical practices, but problems sometimes occur. Call your health care provider if you have any problems or questions after your procedure. WHAT TO EXPECT AFTER THE PROCEDURE After the procedure, it is typical to experience: Sleepiness. Nausea and vomiting. HOME CARE INSTRUCTIONS For the first 24 hours after general anesthesia: Have a responsible person with you. Do not drive a car. If you are alone, do not take public transportation. Do not drink alcohol. Do not take medicine that has not been prescribed by your health care provider. Do not sign important papers or make important decisions. You may resume a normal diet and activities as directed by your health care provider. Change bandages (dressings) as directed. If you have questions or problems that seem related to general anesthesia, call the hospital and ask for the anesthetist or anesthesiologist on call. SEEK MEDICAL CARE IF: You have nausea and vomiting that continue the day after anesthesia. You develop a rash. SEEK IMMEDIATE MEDICAL CARE IF:  You have difficulty breathing. You have chest pain. You have any allergic problems. Document Released: 03/19/2001 Document Revised: 12/16/2013 Document Reviewed: 06/26/2013 [facility] Patient Information 2015 [place], [place]. This  information is not intended to replace advice given to you by your health care provider. Make sure you discuss any questions you have with your health care provider. 2.

## 2015-01-26 ENCOUNTER — Encounter (HOSPITAL_COMMUNITY): Payer: Self-pay | Admitting: Urology

## 2016-12-06 DIAGNOSIS — Z8546 Personal history of malignant neoplasm of prostate: Secondary | ICD-10-CM | POA: Diagnosis not present

## 2016-12-06 DIAGNOSIS — N359 Urethral stricture, unspecified: Secondary | ICD-10-CM | POA: Diagnosis not present

## 2016-12-06 DIAGNOSIS — N5201 Erectile dysfunction due to arterial insufficiency: Secondary | ICD-10-CM | POA: Diagnosis not present

## 2017-01-29 DIAGNOSIS — H2513 Age-related nuclear cataract, bilateral: Secondary | ICD-10-CM | POA: Diagnosis not present

## 2017-01-29 DIAGNOSIS — H524 Presbyopia: Secondary | ICD-10-CM | POA: Diagnosis not present

## 2017-01-29 DIAGNOSIS — H52203 Unspecified astigmatism, bilateral: Secondary | ICD-10-CM | POA: Diagnosis not present

## 2017-01-29 DIAGNOSIS — H40013 Open angle with borderline findings, low risk, bilateral: Secondary | ICD-10-CM | POA: Diagnosis not present

## 2017-01-29 DIAGNOSIS — H5213 Myopia, bilateral: Secondary | ICD-10-CM | POA: Diagnosis not present

## 2017-04-11 DIAGNOSIS — H5712 Ocular pain, left eye: Secondary | ICD-10-CM | POA: Diagnosis not present

## 2017-06-12 DIAGNOSIS — Z8546 Personal history of malignant neoplasm of prostate: Secondary | ICD-10-CM | POA: Diagnosis not present

## 2017-06-15 DIAGNOSIS — Z8546 Personal history of malignant neoplasm of prostate: Secondary | ICD-10-CM | POA: Diagnosis not present

## 2017-06-15 DIAGNOSIS — N5201 Erectile dysfunction due to arterial insufficiency: Secondary | ICD-10-CM | POA: Diagnosis not present

## 2017-09-26 DIAGNOSIS — Z8546 Personal history of malignant neoplasm of prostate: Secondary | ICD-10-CM | POA: Diagnosis not present

## 2017-09-26 DIAGNOSIS — Z0000 Encounter for general adult medical examination without abnormal findings: Secondary | ICD-10-CM | POA: Diagnosis not present

## 2017-09-26 DIAGNOSIS — E7800 Pure hypercholesterolemia, unspecified: Secondary | ICD-10-CM | POA: Diagnosis not present

## 2017-09-26 DIAGNOSIS — Z23 Encounter for immunization: Secondary | ICD-10-CM | POA: Diagnosis not present

## 2018-01-15 DIAGNOSIS — Z8546 Personal history of malignant neoplasm of prostate: Secondary | ICD-10-CM | POA: Diagnosis not present

## 2018-01-22 DIAGNOSIS — N5231 Erectile dysfunction following radical prostatectomy: Secondary | ICD-10-CM | POA: Diagnosis not present

## 2018-01-22 DIAGNOSIS — Z8546 Personal history of malignant neoplasm of prostate: Secondary | ICD-10-CM | POA: Diagnosis not present

## 2018-02-15 DIAGNOSIS — H40013 Open angle with borderline findings, low risk, bilateral: Secondary | ICD-10-CM | POA: Diagnosis not present

## 2018-02-27 DIAGNOSIS — H2513 Age-related nuclear cataract, bilateral: Secondary | ICD-10-CM | POA: Diagnosis not present

## 2018-02-27 DIAGNOSIS — H40013 Open angle with borderline findings, low risk, bilateral: Secondary | ICD-10-CM | POA: Diagnosis not present

## 2018-03-12 DIAGNOSIS — Z1211 Encounter for screening for malignant neoplasm of colon: Secondary | ICD-10-CM | POA: Diagnosis not present

## 2018-08-01 DIAGNOSIS — M79671 Pain in right foot: Secondary | ICD-10-CM | POA: Diagnosis not present

## 2018-08-07 ENCOUNTER — Ambulatory Visit: Payer: BLUE CROSS/BLUE SHIELD | Admitting: Sports Medicine

## 2018-08-07 VITALS — BP 120/70 | Ht 73.0 in | Wt 158.0 lb

## 2018-08-07 DIAGNOSIS — M7742 Metatarsalgia, left foot: Secondary | ICD-10-CM | POA: Diagnosis not present

## 2018-08-07 DIAGNOSIS — M7741 Metatarsalgia, right foot: Secondary | ICD-10-CM | POA: Diagnosis not present

## 2018-08-07 NOTE — Patient Instructions (Addendum)
Take you for coming to see [place] in clinic today.  You were seen today for bilateral foot pain.  We believe this is secondary to a decrease in the arch across the front of her foot (transverse arch).  We have provided you with an orthopedic insert today, as well as some metatarsal cookie pads.  This will hopefully help you as you run to decrease the pain across the metatarsal portion of your foot.  We will see back in 4 weeks for follow-up.

## 2018-08-07 NOTE — Progress Notes (Signed)
   HPI  CC: Bilateral foot pain  Terry Davenport is a 62 year old male with history of herniated disc at L5-S1 who presents today for bilateral foot pain.  He states the pain is been going on since January of this year.  He states that he went for a 26 mile run at that time, and states that afterwards he felt his feet were numb and he had pain in the balls of his feet.  He states that since that time he gets the pain around 6 miles into his run.  He states that he is noticed that over time he is developed blood blisters over the metatarsal heads as well as the ball of his feet.  He states he occasionally gets tingling on the plantar aspect of his bilateral feet.  He states his tingling has been going on for many years, prior to this new complaint.  He denies any weakness in his ankles or feet.  He states he is continued to run, and he does not affected his distance.  He states he tried a gel pad over his metatarsals, with some relief during his runs.  He is wearing a minimalistic shoe when he is running.  Not recall any prior trauma to either feet.  He does have a history of L5-S1 discopathy there was noted around 12 years ago.  He states he is avoiding heavy lifting since that time is had no further issues with it.  Past Injuries: L5-S1 herniated disc Past Surgeries: Cystoscopy in 2016, prostatectomy in 2015 Smoking: Non-smoker Family Hx: Noncontributory  All past medical history, medication, and allergies reviewed by myself at today's visit.  ROS: Per HPI; in addition no fever, no rash, no additional weakness, no additional numbness, no additional paresthesias, and no additional falls/injury.   Objective: BP 120/70   Ht 6\' 1"  (1.854 m)   Wt 158 lb (71.7 kg)   BMI 20.85 kg/m  Gen: NAD, well groomed, a/o x3, normal affect.  CV: Well-perfused. Warm.  Resp: Non-labored.  Neuro: Sensation intact throughout. No gross coordination deficits.  Gait: Nonpathologic posture, loss of transverse arch bilateral  feet.  Normal longitudinal arch.  forefoot varus.  Bilateral foot exam: No warmth, swelling, erythema noted.  Loss of transverse arch noted bilaterally.  Normal longitudinal arch noted.  Forefoot varus noted in bilateral foot.  Blister is noted along the ball of both feet, as well is over the metatarsal heads of toes 3 and 4.  No tenderness to palpation appreciated.  Full range of motion throughout.  Strength 5 out of 5 throughout testing.  Negative anterior drawer.  Assessment and Plan:  Bilateral metatarsalgia  Jaleil had a loss of his transverse arch and physical exam.  He also has noted blistering along the metatarsal heads as well as across the ball of his bilateral feet.  This is likely due to running large distances with a flat transverse arch.  Provided him in clinic today with an orthopedic insert, with a metatarsal cookie in both feet.  He is to wear this while running, and to see if he has any improvement.  We will bring him back to clinic in 4 weeks for reassessment.  If this does provide him with some relief, we could consider getting custom orthotics made to give him a more sturdy longer-lasting pair.  Lewanda Rife, MD [facility] 08/07/2018 11:34 AM

## 2018-10-23 DIAGNOSIS — Z1159 Encounter for screening for other viral diseases: Secondary | ICD-10-CM | POA: Diagnosis not present

## 2018-10-23 DIAGNOSIS — Z0000 Encounter for general adult medical examination without abnormal findings: Secondary | ICD-10-CM | POA: Diagnosis not present

## 2018-10-23 DIAGNOSIS — E7800 Pure hypercholesterolemia, unspecified: Secondary | ICD-10-CM | POA: Diagnosis not present

## 2018-10-23 DIAGNOSIS — Z13228 Encounter for screening for other metabolic disorders: Secondary | ICD-10-CM | POA: Diagnosis not present

## 2019-01-23 DIAGNOSIS — J309 Allergic rhinitis, unspecified: Secondary | ICD-10-CM | POA: Diagnosis not present

## 2019-01-23 DIAGNOSIS — J029 Acute pharyngitis, unspecified: Secondary | ICD-10-CM | POA: Diagnosis not present

## 2019-01-24 DIAGNOSIS — Z8546 Personal history of malignant neoplasm of prostate: Secondary | ICD-10-CM | POA: Diagnosis not present

## 2019-01-29 DIAGNOSIS — N5201 Erectile dysfunction due to arterial insufficiency: Secondary | ICD-10-CM | POA: Diagnosis not present

## 2019-01-29 DIAGNOSIS — C61 Malignant neoplasm of prostate: Secondary | ICD-10-CM | POA: Diagnosis not present

## 2019-04-21 DIAGNOSIS — M25571 Pain in right ankle and joints of right foot: Secondary | ICD-10-CM | POA: Diagnosis not present

## 2019-04-30 DIAGNOSIS — M25571 Pain in right ankle and joints of right foot: Secondary | ICD-10-CM | POA: Diagnosis not present

## 2019-05-21 DIAGNOSIS — M25571 Pain in right ankle and joints of right foot: Secondary | ICD-10-CM | POA: Diagnosis not present

## 2019-06-11 DIAGNOSIS — M25571 Pain in right ankle and joints of right foot: Secondary | ICD-10-CM | POA: Diagnosis not present

## 2019-07-16 DIAGNOSIS — M25571 Pain in right ankle and joints of right foot: Secondary | ICD-10-CM | POA: Diagnosis not present

## 2019-07-16 DIAGNOSIS — M25521 Pain in right elbow: Secondary | ICD-10-CM | POA: Diagnosis not present

## 2019-07-24 DIAGNOSIS — D225 Melanocytic nevi of trunk: Secondary | ICD-10-CM | POA: Diagnosis not present

## 2019-07-24 DIAGNOSIS — L720 Epidermal cyst: Secondary | ICD-10-CM | POA: Diagnosis not present

## 2019-07-24 DIAGNOSIS — L821 Other seborrheic keratosis: Secondary | ICD-10-CM | POA: Diagnosis not present

## 2019-07-24 DIAGNOSIS — D1801 Hemangioma of skin and subcutaneous tissue: Secondary | ICD-10-CM | POA: Diagnosis not present

## 2019-12-17 DIAGNOSIS — M25511 Pain in right shoulder: Secondary | ICD-10-CM | POA: Diagnosis not present

## 2020-01-14 DIAGNOSIS — C61 Malignant neoplasm of prostate: Secondary | ICD-10-CM | POA: Diagnosis not present

## 2020-01-20 DIAGNOSIS — Z0000 Encounter for general adult medical examination without abnormal findings: Secondary | ICD-10-CM | POA: Diagnosis not present

## 2020-01-20 DIAGNOSIS — Z8546 Personal history of malignant neoplasm of prostate: Secondary | ICD-10-CM | POA: Diagnosis not present

## 2020-01-20 DIAGNOSIS — E7800 Pure hypercholesterolemia, unspecified: Secondary | ICD-10-CM | POA: Diagnosis not present

## 2020-01-21 DIAGNOSIS — Z8546 Personal history of malignant neoplasm of prostate: Secondary | ICD-10-CM | POA: Diagnosis not present

## 2020-01-21 DIAGNOSIS — M5412 Radiculopathy, cervical region: Secondary | ICD-10-CM | POA: Diagnosis not present

## 2020-01-21 DIAGNOSIS — M7581 Other shoulder lesions, right shoulder: Secondary | ICD-10-CM | POA: Diagnosis not present

## 2020-01-21 DIAGNOSIS — N5201 Erectile dysfunction due to arterial insufficiency: Secondary | ICD-10-CM | POA: Diagnosis not present

## 2020-02-11 DIAGNOSIS — H40013 Open angle with borderline findings, low risk, bilateral: Secondary | ICD-10-CM | POA: Diagnosis not present

## 2020-02-11 DIAGNOSIS — H2513 Age-related nuclear cataract, bilateral: Secondary | ICD-10-CM | POA: Diagnosis not present

## 2020-02-19 DIAGNOSIS — E7800 Pure hypercholesterolemia, unspecified: Secondary | ICD-10-CM | POA: Diagnosis not present

## 2020-02-19 DIAGNOSIS — Z0000 Encounter for general adult medical examination without abnormal findings: Secondary | ICD-10-CM | POA: Diagnosis not present

## 2020-04-02 DIAGNOSIS — M79671 Pain in right foot: Secondary | ICD-10-CM | POA: Diagnosis not present

## 2020-04-02 DIAGNOSIS — M25774 Osteophyte, right foot: Secondary | ICD-10-CM | POA: Diagnosis not present

## 2020-04-02 DIAGNOSIS — L600 Ingrowing nail: Secondary | ICD-10-CM | POA: Diagnosis not present

## 2020-04-16 DIAGNOSIS — M79675 Pain in left toe(s): Secondary | ICD-10-CM | POA: Diagnosis not present

## 2020-04-16 DIAGNOSIS — L600 Ingrowing nail: Secondary | ICD-10-CM | POA: Diagnosis not present

## 2020-04-16 DIAGNOSIS — M79674 Pain in right toe(s): Secondary | ICD-10-CM | POA: Diagnosis not present

## 2020-05-10 DIAGNOSIS — Z20828 Contact with and (suspected) exposure to other viral communicable diseases: Secondary | ICD-10-CM | POA: Diagnosis not present

## 2020-05-10 DIAGNOSIS — Z03818 Encounter for observation for suspected exposure to other biological agents ruled out: Secondary | ICD-10-CM | POA: Diagnosis not present

## 2020-07-13 DIAGNOSIS — D1801 Hemangioma of skin and subcutaneous tissue: Secondary | ICD-10-CM | POA: Diagnosis not present

## 2020-07-13 DIAGNOSIS — L814 Other melanin hyperpigmentation: Secondary | ICD-10-CM | POA: Diagnosis not present

## 2020-07-13 DIAGNOSIS — L821 Other seborrheic keratosis: Secondary | ICD-10-CM | POA: Diagnosis not present

## 2020-07-13 DIAGNOSIS — D225 Melanocytic nevi of trunk: Secondary | ICD-10-CM | POA: Diagnosis not present

## 2020-07-30 DIAGNOSIS — M25512 Pain in left shoulder: Secondary | ICD-10-CM | POA: Diagnosis not present

## 2020-08-06 DIAGNOSIS — M6281 Muscle weakness (generalized): Secondary | ICD-10-CM | POA: Diagnosis not present

## 2020-08-06 DIAGNOSIS — S4352XD Sprain of left acromioclavicular joint, subsequent encounter: Secondary | ICD-10-CM | POA: Diagnosis not present

## 2020-08-23 DIAGNOSIS — M6281 Muscle weakness (generalized): Secondary | ICD-10-CM | POA: Diagnosis not present

## 2020-08-23 DIAGNOSIS — S4352XD Sprain of left acromioclavicular joint, subsequent encounter: Secondary | ICD-10-CM | POA: Diagnosis not present

## 2020-09-10 DIAGNOSIS — M25512 Pain in left shoulder: Secondary | ICD-10-CM | POA: Diagnosis not present

## 2021-01-24 DIAGNOSIS — Z0000 Encounter for general adult medical examination without abnormal findings: Secondary | ICD-10-CM | POA: Diagnosis not present

## 2021-01-27 DIAGNOSIS — R5383 Other fatigue: Secondary | ICD-10-CM | POA: Diagnosis not present

## 2021-01-27 DIAGNOSIS — E7800 Pure hypercholesterolemia, unspecified: Secondary | ICD-10-CM | POA: Diagnosis not present

## 2021-01-27 DIAGNOSIS — Z23 Encounter for immunization: Secondary | ICD-10-CM | POA: Diagnosis not present

## 2021-03-18 DIAGNOSIS — H2513 Age-related nuclear cataract, bilateral: Secondary | ICD-10-CM | POA: Diagnosis not present

## 2021-03-18 DIAGNOSIS — H40013 Open angle with borderline findings, low risk, bilateral: Secondary | ICD-10-CM | POA: Diagnosis not present

## 2021-04-13 DIAGNOSIS — C61 Malignant neoplasm of prostate: Secondary | ICD-10-CM | POA: Diagnosis not present

## 2021-04-20 DIAGNOSIS — N5201 Erectile dysfunction due to arterial insufficiency: Secondary | ICD-10-CM | POA: Diagnosis not present

## 2021-04-20 DIAGNOSIS — Z8546 Personal history of malignant neoplasm of prostate: Secondary | ICD-10-CM | POA: Diagnosis not present

## 2021-04-26 DIAGNOSIS — Z20822 Contact with and (suspected) exposure to covid-19: Secondary | ICD-10-CM | POA: Diagnosis not present

## 2021-04-28 DIAGNOSIS — U071 COVID-19: Secondary | ICD-10-CM | POA: Diagnosis not present

## 2021-05-30 DIAGNOSIS — E7800 Pure hypercholesterolemia, unspecified: Secondary | ICD-10-CM | POA: Diagnosis not present

## 2021-06-08 ENCOUNTER — Other Ambulatory Visit (HOSPITAL_COMMUNITY): Payer: Self-pay | Admitting: Family Medicine

## 2021-06-16 ENCOUNTER — Ambulatory Visit (HOSPITAL_COMMUNITY)
Admission: RE | Admit: 2021-06-16 | Discharge: 2021-06-16 | Disposition: A | Discharge status: Home / Self Care | Payer: Self-pay | Source: Ambulatory Visit | Attending: Family Medicine | Admitting: Family Medicine

## 2021-06-16 ENCOUNTER — Other Ambulatory Visit: Payer: Self-pay

## 2021-06-16 DIAGNOSIS — E7800 Pure hypercholesterolemia, unspecified: Secondary | ICD-10-CM | POA: Insufficient documentation

## 2021-07-14 DIAGNOSIS — D225 Melanocytic nevi of trunk: Secondary | ICD-10-CM | POA: Diagnosis not present

## 2021-07-14 DIAGNOSIS — L814 Other melanin hyperpigmentation: Secondary | ICD-10-CM | POA: Diagnosis not present

## 2021-07-14 DIAGNOSIS — L821 Other seborrheic keratosis: Secondary | ICD-10-CM | POA: Diagnosis not present

## 2021-07-14 DIAGNOSIS — L738 Other specified follicular disorders: Secondary | ICD-10-CM | POA: Diagnosis not present

## 2021-07-14 DIAGNOSIS — D485 Neoplasm of uncertain behavior of skin: Secondary | ICD-10-CM | POA: Diagnosis not present

## 2021-08-08 DIAGNOSIS — R109 Unspecified abdominal pain: Secondary | ICD-10-CM | POA: Diagnosis not present

## 2021-09-12 DIAGNOSIS — H40013 Open angle with borderline findings, low risk, bilateral: Secondary | ICD-10-CM | POA: Diagnosis not present

## 2021-11-28 DIAGNOSIS — M25512 Pain in left shoulder: Secondary | ICD-10-CM | POA: Diagnosis not present

## 2022-01-13 IMAGING — CT CT CARDIAC CORONARY ARTERY CALCIUM SCORE
3 series · 14 of 20 positions shown, 15 images · non-contrast
Comparison: No priors.
COMPARISON: No priors.

Addendum:
EXAM:
OVER-READ INTERPRETATION  CT CHEST

The following report is an over-read performed by radiologist Dr.
Maree Fain [REDACTED] on 06/16/2021. This
over-read does not include interpretation of cardiac or coronary
anatomy or pathology. The coronary calcium score interpretation by
the cardiologist is attached.
CLINICAL DATA: Cardiovascular Disease Risk stratification
Coronary Calcium Score
TECHNIQUE: A gated, non-contrast computed tomography scan of the heart was
performed using 3mm slice thickness. Axial images were analyzed on a
dedicated workstation. Calcium scoring of the coronary arteries was
performed using the Agatston method.

[Series 3: ax ca scr 70% (id) · axial · 0.39mm/px · z∈[-284,-156]mm · 6 of 90 slices shown]
[im 13/90  vessel]
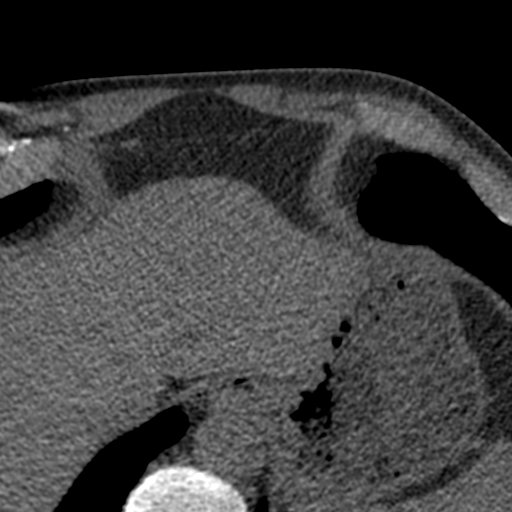
[im 26/90  vessel]
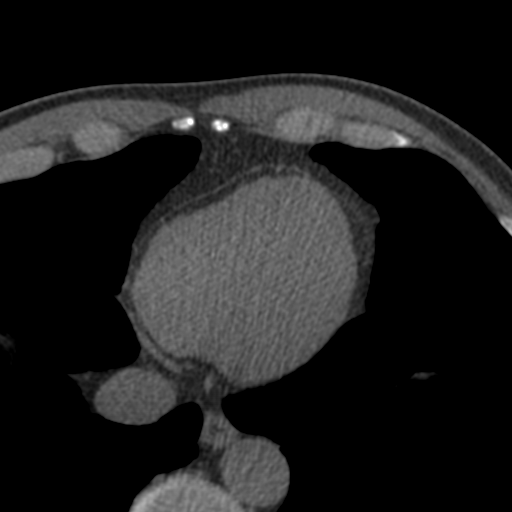
[im 39/90  vessel]
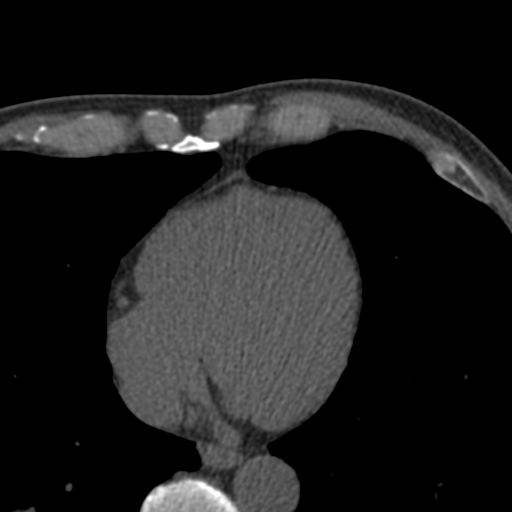
[im 51/90  vessel]
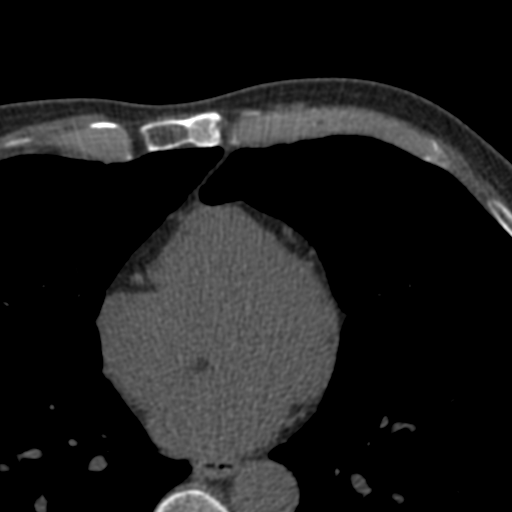
[im 64/90  vessel]
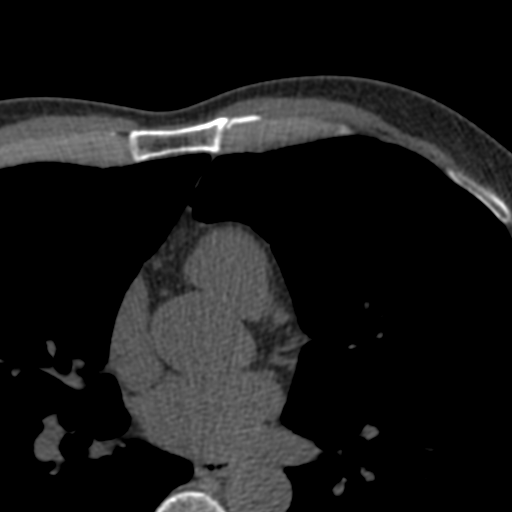
[im 77/90  vessel]
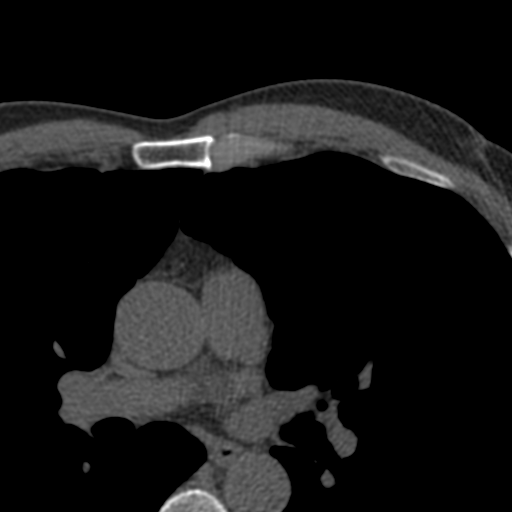

[Series 4: ax st · axial · 0.72mm/px · z∈[-274,-166]mm · 4 of 60 slices shown, 5 images]
[im 12/60  vessel]
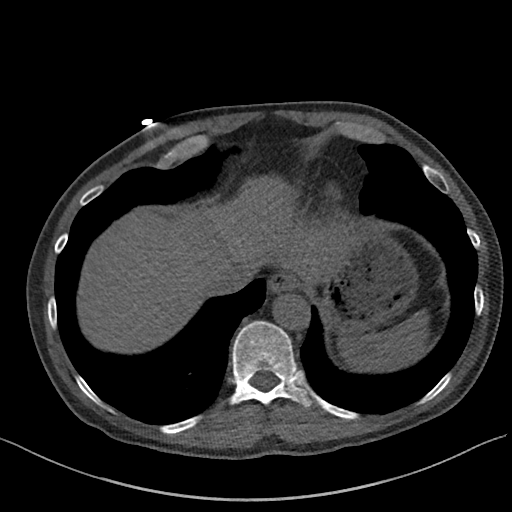
[im 12/60  lung]
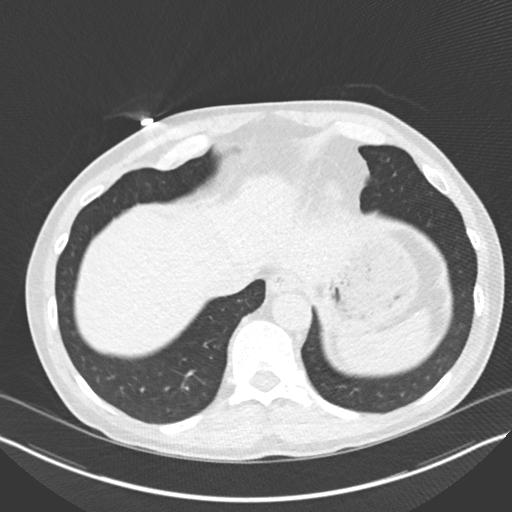
[im 24/60  vessel]
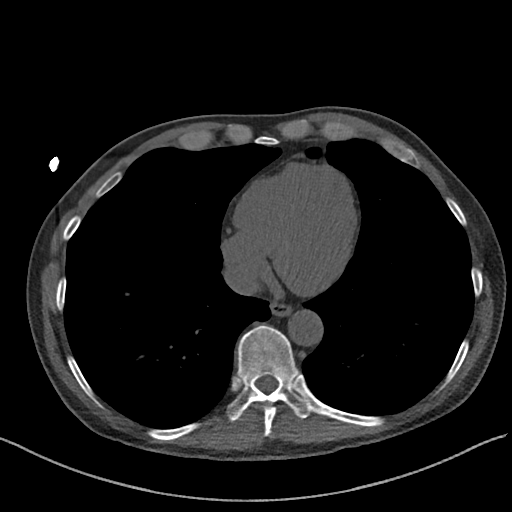
[im 36/60  vessel]
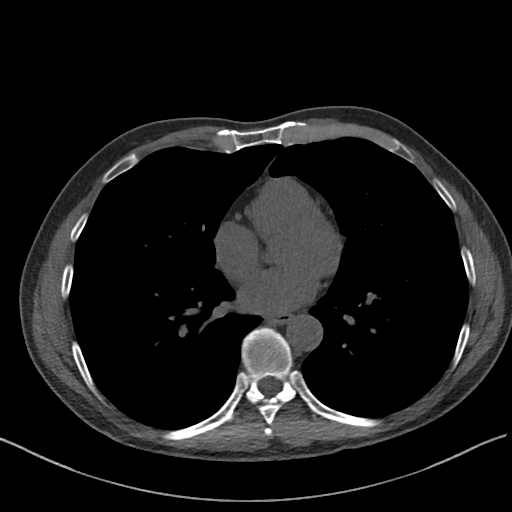
[im 48/60  vessel]
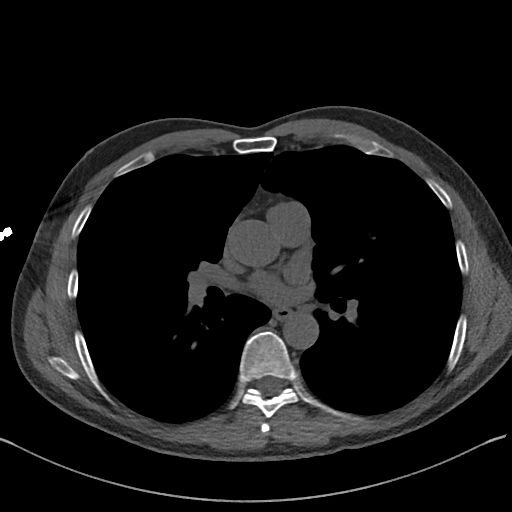

[Series 5: ax lung · axial · 0.72mm/px · z∈[-274,-166]mm · 4 of 60 slices shown]
[im 12/60  lung]
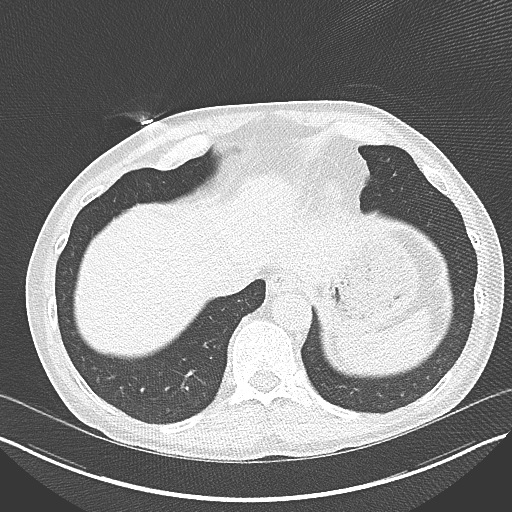
[im 24/60  lung]
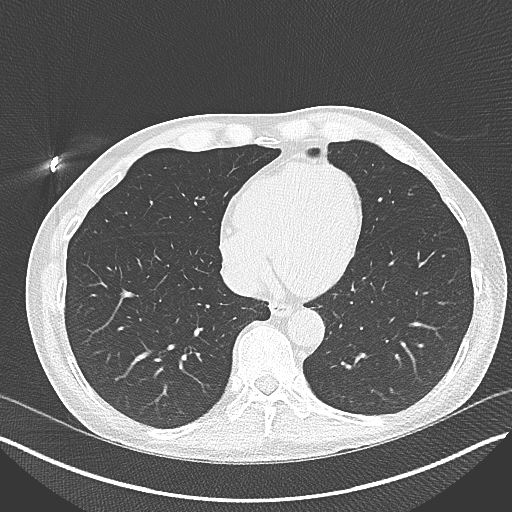
[im 36/60  lung]
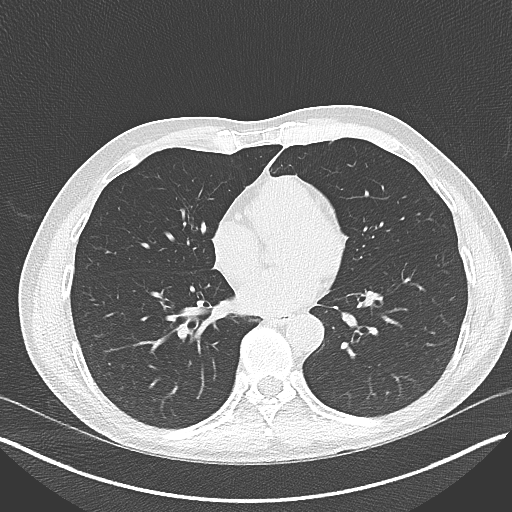
[im 48/60  lung]
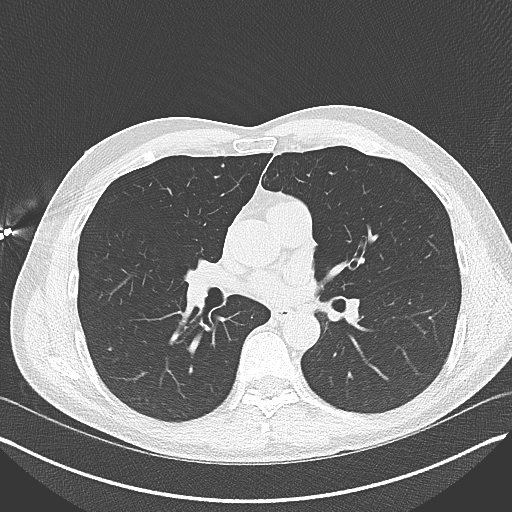

[14 of 20 positions shown; findings below may reference images not displayed]

FINDINGS: Within the visualized portions of the thorax there are no suspicious
appearing pulmonary nodules or masses, there is no acute
consolidative airspace disease, no pleural effusions, no
pneumothorax and no lymphadenopathy. Visualized portions of the
upper abdomen are unremarkable. There are no aggressive appearing
lytic or blastic lesions noted in the visualized portions of the
skeleton.
IMPRESSION: 1. No significant incidental noncardiac findings are noted.
FINDINGS: Coronary arteries: Normal origins.

Coronary Calcium Score:

Left main: 0

Left anterior descending artery: 0

Left circumflex artery: 0

Right coronary artery: 0

Total: 0

Percentile:

Pericardium: Normal.

Ascending Aorta: Normal caliber.

Non-cardiac: See separate report from [REDACTED].
IMPRESSION: Coronary calcium score of 0.



If CAC=0, it is reasonable to withhold statin therapy and reassess
in 5 to 10 years, as long as higher risk conditions are absent
(diabetes mellitus, family history of premature CHD in first degree
relatives (males <55 years; females <65 years), cigarette smoking,
or LDL >=190 mg/dL).

If CAC is 1 to 99, it is reasonable to initiate statin therapy for
patients >=55 years of age.

If CAC is >=100 or >=75th percentile, it is reasonable to initiate
statin therapy at any age.

Cardiology referral should be considered for patients with CAC
scores >=400 or >=75th percentile.

*6972 AHA/ACC/AACVPR/AAPA/ABC/JISELA/DILLER/KRIZTAL/Belt/TSAMORENA/JIM/CHRISTOFORUS
Guideline on the Management of Blood Cholesterol: A Report of the
American College of Cardiology/American Heart Association Task Force
on Clinical Practice Guidelines. J Am Coll Cardiol.
4700;73(24):1794-1011.

*** End of Addendum ***
EXAM:
OVER-READ INTERPRETATION  CT CHEST

The following report is an over-read performed by radiologist Dr.
Maree Fain [REDACTED] on 06/16/2021. This
over-read does not include interpretation of cardiac or coronary
anatomy or pathology. The coronary calcium score interpretation by
the cardiologist is attached.
FINDINGS: Within the visualized portions of the thorax there are no suspicious
appearing pulmonary nodules or masses, there is no acute
consolidative airspace disease, no pleural effusions, no
pneumothorax and no lymphadenopathy. Visualized portions of the
upper abdomen are unremarkable. There are no aggressive appearing
lytic or blastic lesions noted in the visualized portions of the
skeleton.
IMPRESSION: 1. No significant incidental noncardiac findings are noted.

## 2022-01-25 DIAGNOSIS — L570 Actinic keratosis: Secondary | ICD-10-CM | POA: Diagnosis not present

## 2022-01-25 DIAGNOSIS — D485 Neoplasm of uncertain behavior of skin: Secondary | ICD-10-CM | POA: Diagnosis not present

## 2022-02-14 DIAGNOSIS — L570 Actinic keratosis: Secondary | ICD-10-CM | POA: Diagnosis not present

## 2022-02-14 DIAGNOSIS — D485 Neoplasm of uncertain behavior of skin: Secondary | ICD-10-CM | POA: Diagnosis not present

## 2022-03-09 DIAGNOSIS — D485 Neoplasm of uncertain behavior of skin: Secondary | ICD-10-CM | POA: Diagnosis not present

## 2022-03-09 DIAGNOSIS — L280 Lichen simplex chronicus: Secondary | ICD-10-CM | POA: Diagnosis not present

## 2022-03-20 DIAGNOSIS — H2513 Age-related nuclear cataract, bilateral: Secondary | ICD-10-CM | POA: Diagnosis not present

## 2022-03-20 DIAGNOSIS — H40013 Open angle with borderline findings, low risk, bilateral: Secondary | ICD-10-CM | POA: Diagnosis not present

## 2022-04-14 DIAGNOSIS — Z8546 Personal history of malignant neoplasm of prostate: Secondary | ICD-10-CM | POA: Diagnosis not present

## 2022-04-21 DIAGNOSIS — Z8546 Personal history of malignant neoplasm of prostate: Secondary | ICD-10-CM | POA: Diagnosis not present

## 2022-04-21 DIAGNOSIS — N5201 Erectile dysfunction due to arterial insufficiency: Secondary | ICD-10-CM | POA: Diagnosis not present

## 2022-05-02 DIAGNOSIS — W182XXA Fall in (into) shower or empty bathtub, initial encounter: Secondary | ICD-10-CM | POA: Diagnosis not present

## 2022-05-02 DIAGNOSIS — S29002A Unspecified injury of muscle and tendon of back wall of thorax, initial encounter: Secondary | ICD-10-CM | POA: Diagnosis not present

## 2022-05-04 DIAGNOSIS — S20212D Contusion of left front wall of thorax, subsequent encounter: Secondary | ICD-10-CM | POA: Diagnosis not present

## 2022-08-14 DIAGNOSIS — E7800 Pure hypercholesterolemia, unspecified: Secondary | ICD-10-CM | POA: Diagnosis not present

## 2022-08-14 DIAGNOSIS — Z79899 Other long term (current) drug therapy: Secondary | ICD-10-CM | POA: Diagnosis not present

## 2022-08-14 DIAGNOSIS — Z0000 Encounter for general adult medical examination without abnormal findings: Secondary | ICD-10-CM | POA: Diagnosis not present

## 2022-08-14 DIAGNOSIS — M545 Low back pain, unspecified: Secondary | ICD-10-CM | POA: Diagnosis not present

## 2022-08-14 DIAGNOSIS — Z789 Other specified health status: Secondary | ICD-10-CM | POA: Diagnosis not present

## 2022-10-17 DIAGNOSIS — H2513 Age-related nuclear cataract, bilateral: Secondary | ICD-10-CM | POA: Diagnosis not present

## 2022-10-17 DIAGNOSIS — H52203 Unspecified astigmatism, bilateral: Secondary | ICD-10-CM | POA: Diagnosis not present

## 2022-10-23 DIAGNOSIS — D225 Melanocytic nevi of trunk: Secondary | ICD-10-CM | POA: Diagnosis not present

## 2022-10-23 DIAGNOSIS — L989 Disorder of the skin and subcutaneous tissue, unspecified: Secondary | ICD-10-CM | POA: Diagnosis not present

## 2022-10-23 DIAGNOSIS — L02821 Furuncle of head [any part, except face]: Secondary | ICD-10-CM | POA: Diagnosis not present

## 2022-10-23 DIAGNOSIS — L814 Other melanin hyperpigmentation: Secondary | ICD-10-CM | POA: Diagnosis not present

## 2022-10-23 DIAGNOSIS — D485 Neoplasm of uncertain behavior of skin: Secondary | ICD-10-CM | POA: Diagnosis not present

## 2022-10-23 DIAGNOSIS — L821 Other seborrheic keratosis: Secondary | ICD-10-CM | POA: Diagnosis not present

## 2022-11-13 DIAGNOSIS — L814 Other melanin hyperpigmentation: Secondary | ICD-10-CM | POA: Diagnosis not present

## 2022-11-13 DIAGNOSIS — L905 Scar conditions and fibrosis of skin: Secondary | ICD-10-CM | POA: Diagnosis not present

## 2023-02-15 DIAGNOSIS — L814 Other melanin hyperpigmentation: Secondary | ICD-10-CM | POA: Diagnosis not present

## 2023-02-15 DIAGNOSIS — Z872 Personal history of diseases of the skin and subcutaneous tissue: Secondary | ICD-10-CM | POA: Diagnosis not present

## 2023-02-15 DIAGNOSIS — L821 Other seborrheic keratosis: Secondary | ICD-10-CM | POA: Diagnosis not present

## 2023-02-15 DIAGNOSIS — D225 Melanocytic nevi of trunk: Secondary | ICD-10-CM | POA: Diagnosis not present

## 2023-04-05 DIAGNOSIS — Z789 Other specified health status: Secondary | ICD-10-CM | POA: Diagnosis not present

## 2023-04-05 DIAGNOSIS — R451 Restlessness and agitation: Secondary | ICD-10-CM | POA: Diagnosis not present

## 2023-04-05 DIAGNOSIS — E559 Vitamin D deficiency, unspecified: Secondary | ICD-10-CM | POA: Diagnosis not present

## 2023-04-27 DIAGNOSIS — Z8546 Personal history of malignant neoplasm of prostate: Secondary | ICD-10-CM | POA: Diagnosis not present

## 2023-05-04 DIAGNOSIS — N5201 Erectile dysfunction due to arterial insufficiency: Secondary | ICD-10-CM | POA: Diagnosis not present

## 2023-05-04 DIAGNOSIS — Z8546 Personal history of malignant neoplasm of prostate: Secondary | ICD-10-CM | POA: Diagnosis not present

## 2023-05-15 DIAGNOSIS — R5383 Other fatigue: Secondary | ICD-10-CM | POA: Diagnosis not present

## 2023-05-15 DIAGNOSIS — B349 Viral infection, unspecified: Secondary | ICD-10-CM | POA: Diagnosis not present

## 2023-05-15 DIAGNOSIS — Z03818 Encounter for observation for suspected exposure to other biological agents ruled out: Secondary | ICD-10-CM | POA: Diagnosis not present

## 2023-05-15 DIAGNOSIS — R52 Pain, unspecified: Secondary | ICD-10-CM | POA: Diagnosis not present

## 2023-08-01 DIAGNOSIS — H43813 Vitreous degeneration, bilateral: Secondary | ICD-10-CM | POA: Diagnosis not present

## 2023-08-20 DIAGNOSIS — R7309 Other abnormal glucose: Secondary | ICD-10-CM | POA: Diagnosis not present

## 2023-08-20 DIAGNOSIS — Z789 Other specified health status: Secondary | ICD-10-CM | POA: Diagnosis not present

## 2023-08-20 DIAGNOSIS — E7800 Pure hypercholesterolemia, unspecified: Secondary | ICD-10-CM | POA: Diagnosis not present

## 2023-08-20 DIAGNOSIS — Z79899 Other long term (current) drug therapy: Secondary | ICD-10-CM | POA: Diagnosis not present

## 2023-08-21 DIAGNOSIS — H43813 Vitreous degeneration, bilateral: Secondary | ICD-10-CM | POA: Diagnosis not present

## 2023-08-22 DIAGNOSIS — Z0000 Encounter for general adult medical examination without abnormal findings: Secondary | ICD-10-CM | POA: Diagnosis not present

## 2023-09-12 DIAGNOSIS — R946 Abnormal results of thyroid function studies: Secondary | ICD-10-CM | POA: Diagnosis not present

## 2023-10-23 DIAGNOSIS — H40013 Open angle with borderline findings, low risk, bilateral: Secondary | ICD-10-CM | POA: Diagnosis not present

## 2023-10-23 DIAGNOSIS — H43812 Vitreous degeneration, left eye: Secondary | ICD-10-CM | POA: Diagnosis not present

## 2023-10-23 DIAGNOSIS — H52203 Unspecified astigmatism, bilateral: Secondary | ICD-10-CM | POA: Diagnosis not present

## 2023-10-23 DIAGNOSIS — H2513 Age-related nuclear cataract, bilateral: Secondary | ICD-10-CM | POA: Diagnosis not present

## 2023-10-24 DIAGNOSIS — Z872 Personal history of diseases of the skin and subcutaneous tissue: Secondary | ICD-10-CM | POA: Diagnosis not present

## 2023-10-24 DIAGNOSIS — D225 Melanocytic nevi of trunk: Secondary | ICD-10-CM | POA: Diagnosis not present

## 2023-10-24 DIAGNOSIS — L814 Other melanin hyperpigmentation: Secondary | ICD-10-CM | POA: Diagnosis not present

## 2023-10-24 DIAGNOSIS — L821 Other seborrheic keratosis: Secondary | ICD-10-CM | POA: Diagnosis not present

## 2024-04-25 DIAGNOSIS — Z8546 Personal history of malignant neoplasm of prostate: Secondary | ICD-10-CM | POA: Diagnosis not present

## 2024-05-02 DIAGNOSIS — N5201 Erectile dysfunction due to arterial insufficiency: Secondary | ICD-10-CM | POA: Diagnosis not present

## 2024-05-02 DIAGNOSIS — Z8546 Personal history of malignant neoplasm of prostate: Secondary | ICD-10-CM | POA: Diagnosis not present

## 2024-08-18 DIAGNOSIS — Z79899 Other long term (current) drug therapy: Secondary | ICD-10-CM | POA: Diagnosis not present

## 2024-08-18 DIAGNOSIS — R946 Abnormal results of thyroid function studies: Secondary | ICD-10-CM | POA: Diagnosis not present

## 2024-08-18 DIAGNOSIS — R5383 Other fatigue: Secondary | ICD-10-CM | POA: Diagnosis not present

## 2024-08-18 DIAGNOSIS — R7309 Other abnormal glucose: Secondary | ICD-10-CM | POA: Diagnosis not present

## 2024-08-18 DIAGNOSIS — Z789 Other specified health status: Secondary | ICD-10-CM | POA: Diagnosis not present

## 2024-08-18 DIAGNOSIS — Z0000 Encounter for general adult medical examination without abnormal findings: Secondary | ICD-10-CM | POA: Diagnosis not present

## 2024-08-18 DIAGNOSIS — E7800 Pure hypercholesterolemia, unspecified: Secondary | ICD-10-CM | POA: Diagnosis not present

## 2024-08-22 DIAGNOSIS — E7800 Pure hypercholesterolemia, unspecified: Secondary | ICD-10-CM | POA: Diagnosis not present

## 2024-08-22 DIAGNOSIS — Z0000 Encounter for general adult medical examination without abnormal findings: Secondary | ICD-10-CM | POA: Diagnosis not present

## 2024-08-22 DIAGNOSIS — R7309 Other abnormal glucose: Secondary | ICD-10-CM | POA: Diagnosis not present

## 2024-08-22 DIAGNOSIS — R7989 Other specified abnormal findings of blood chemistry: Secondary | ICD-10-CM | POA: Diagnosis not present

## 2024-08-22 DIAGNOSIS — Z79899 Other long term (current) drug therapy: Secondary | ICD-10-CM | POA: Diagnosis not present

## 2024-10-03 DIAGNOSIS — R5383 Other fatigue: Secondary | ICD-10-CM | POA: Diagnosis not present

## 2024-10-08 DIAGNOSIS — H0015 Chalazion left lower eyelid: Secondary | ICD-10-CM | POA: Diagnosis not present

## 2024-10-10 DIAGNOSIS — H0015 Chalazion left lower eyelid: Secondary | ICD-10-CM | POA: Diagnosis not present

## 2024-10-21 DIAGNOSIS — G479 Sleep disorder, unspecified: Secondary | ICD-10-CM | POA: Diagnosis not present

## 2024-10-21 DIAGNOSIS — R351 Nocturia: Secondary | ICD-10-CM | POA: Diagnosis not present

## 2024-10-22 DIAGNOSIS — H0015 Chalazion left lower eyelid: Secondary | ICD-10-CM | POA: Diagnosis not present

## 2024-10-22 DIAGNOSIS — H5213 Myopia, bilateral: Secondary | ICD-10-CM | POA: Diagnosis not present

## 2024-10-22 DIAGNOSIS — H2513 Age-related nuclear cataract, bilateral: Secondary | ICD-10-CM | POA: Diagnosis not present

## 2024-10-28 DIAGNOSIS — L728 Other follicular cysts of the skin and subcutaneous tissue: Secondary | ICD-10-CM | POA: Diagnosis not present

## 2024-10-28 DIAGNOSIS — D1801 Hemangioma of skin and subcutaneous tissue: Secondary | ICD-10-CM | POA: Diagnosis not present

## 2024-10-28 DIAGNOSIS — L814 Other melanin hyperpigmentation: Secondary | ICD-10-CM | POA: Diagnosis not present

## 2024-10-28 DIAGNOSIS — L821 Other seborrheic keratosis: Secondary | ICD-10-CM | POA: Diagnosis not present

## 2025-01-30 ENCOUNTER — Telehealth: Payer: Self-pay

## 2025-01-30 ENCOUNTER — Ambulatory Visit: Admitting: Podiatry

## 2025-01-30 NOTE — Telephone Encounter (Signed)
 Patient called and left message regarding after care instruction.Patient stated that he did not receive instructions. I returned call to patient,but no answer. I left detailed message for patient with after care instructions.

## 2025-01-30 NOTE — Progress Notes (Unsigned)
 Right medial hallux removed before; but  Goes through cystes

## 2025-01-30 NOTE — Patient Instructions (Signed)
